# Patient Record
Sex: Female | Born: 1985 | Race: Black or African American | Hispanic: No | Marital: Single | Smoking: Never smoker
Health system: Southern US, Community
[De-identification: ages and names within clinical notes are randomized; demographics above are authoritative.]

## PROBLEM LIST (undated history)

## (undated) DIAGNOSIS — K5909 Other constipation: Secondary | ICD-10-CM

## (undated) DIAGNOSIS — F329 Major depressive disorder, single episode, unspecified: Secondary | ICD-10-CM

## (undated) DIAGNOSIS — Z98891 History of uterine scar from previous surgery: Secondary | ICD-10-CM

## (undated) DIAGNOSIS — F32A Depression, unspecified: Secondary | ICD-10-CM

## (undated) HISTORY — PX: TUBAL LIGATION: SHX77

---
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
## 2003-01-09 ENCOUNTER — Emergency Department (HOSPITAL_COMMUNITY): Admission: EM | Admit: 2003-01-09 | Discharge: 2003-01-09 | Payer: Self-pay | Admitting: Emergency Medicine

## 2003-03-17 ENCOUNTER — Inpatient Hospital Stay (HOSPITAL_COMMUNITY): Admission: AD | Admit: 2003-03-17 | Discharge: 2003-03-17 | Payer: Self-pay | Admitting: Obstetrics and Gynecology

## 2003-03-26 ENCOUNTER — Emergency Department (HOSPITAL_COMMUNITY): Admission: AD | Admit: 2003-03-26 | Discharge: 2003-03-26 | Payer: Self-pay | Admitting: Family Medicine

## 2003-04-21 ENCOUNTER — Inpatient Hospital Stay (HOSPITAL_COMMUNITY): Admission: AD | Admit: 2003-04-21 | Discharge: 2003-04-21 | Payer: Self-pay | Admitting: Obstetrics and Gynecology

## 2003-05-12 ENCOUNTER — Inpatient Hospital Stay (HOSPITAL_COMMUNITY): Admission: AD | Admit: 2003-05-12 | Discharge: 2003-05-12 | Payer: Self-pay | Admitting: Obstetrics and Gynecology

## 2003-07-01 ENCOUNTER — Emergency Department (HOSPITAL_COMMUNITY): Admission: EM | Admit: 2003-07-01 | Discharge: 2003-07-01 | Payer: Self-pay | Admitting: Emergency Medicine

## 2003-07-07 ENCOUNTER — Emergency Department (HOSPITAL_COMMUNITY): Admission: EM | Admit: 2003-07-07 | Discharge: 2003-07-08 | Payer: Self-pay | Admitting: Emergency Medicine

## 2003-07-13 ENCOUNTER — Inpatient Hospital Stay (HOSPITAL_COMMUNITY): Admission: AD | Admit: 2003-07-13 | Discharge: 2003-07-14 | Payer: Self-pay | Admitting: Family Medicine

## 2003-08-26 ENCOUNTER — Ambulatory Visit (HOSPITAL_COMMUNITY): Admission: RE | Admit: 2003-08-26 | Discharge: 2003-08-26 | Payer: Self-pay | Admitting: Obstetrics & Gynecology

## 2003-09-05 ENCOUNTER — Inpatient Hospital Stay (HOSPITAL_COMMUNITY): Admission: AD | Admit: 2003-09-05 | Discharge: 2003-09-05 | Payer: Self-pay | Admitting: Obstetrics

## 2003-09-23 ENCOUNTER — Inpatient Hospital Stay (HOSPITAL_COMMUNITY): Admission: AD | Admit: 2003-09-23 | Discharge: 2003-09-24 | Payer: Self-pay | Admitting: Obstetrics

## 2003-12-14 ENCOUNTER — Inpatient Hospital Stay (HOSPITAL_COMMUNITY): Admission: AD | Admit: 2003-12-14 | Discharge: 2003-12-16 | Payer: Self-pay | Admitting: Obstetrics

## 2004-08-13 ENCOUNTER — Emergency Department (HOSPITAL_COMMUNITY): Admission: EM | Admit: 2004-08-13 | Discharge: 2004-08-13 | Payer: Self-pay | Admitting: Emergency Medicine

## 2005-03-15 ENCOUNTER — Inpatient Hospital Stay (HOSPITAL_COMMUNITY): Admission: AD | Admit: 2005-03-15 | Discharge: 2005-03-15 | Payer: Self-pay | Admitting: Obstetrics & Gynecology

## 2005-03-22 ENCOUNTER — Inpatient Hospital Stay (HOSPITAL_COMMUNITY): Admission: RE | Admit: 2005-03-22 | Discharge: 2005-03-22 | Payer: Self-pay | Admitting: Obstetrics & Gynecology

## 2005-04-08 ENCOUNTER — Emergency Department (HOSPITAL_COMMUNITY): Admission: EM | Admit: 2005-04-08 | Discharge: 2005-04-08 | Payer: Self-pay | Admitting: Family Medicine

## 2005-06-09 ENCOUNTER — Emergency Department (HOSPITAL_COMMUNITY): Admission: EM | Admit: 2005-06-09 | Discharge: 2005-06-09 | Payer: Self-pay | Admitting: Family Medicine

## 2005-06-11 ENCOUNTER — Emergency Department (HOSPITAL_COMMUNITY): Admission: EM | Admit: 2005-06-11 | Discharge: 2005-06-11 | Payer: Self-pay | Admitting: Family Medicine

## 2005-07-04 ENCOUNTER — Emergency Department (HOSPITAL_COMMUNITY): Admission: EM | Admit: 2005-07-04 | Discharge: 2005-07-04 | Payer: Self-pay | Admitting: Family Medicine

## 2005-08-01 ENCOUNTER — Emergency Department (HOSPITAL_COMMUNITY): Admission: EM | Admit: 2005-08-01 | Discharge: 2005-08-01 | Payer: Self-pay | Admitting: Family Medicine

## 2005-11-20 ENCOUNTER — Inpatient Hospital Stay (HOSPITAL_COMMUNITY): Admission: AD | Admit: 2005-11-20 | Discharge: 2005-11-20 | Payer: Self-pay | Admitting: Obstetrics & Gynecology

## 2005-11-22 ENCOUNTER — Inpatient Hospital Stay (HOSPITAL_COMMUNITY): Admission: AD | Admit: 2005-11-22 | Discharge: 2005-11-22 | Payer: Self-pay | Admitting: Obstetrics & Gynecology

## 2006-02-08 ENCOUNTER — Other Ambulatory Visit: Admission: RE | Admit: 2006-02-08 | Discharge: 2006-02-08 | Payer: Self-pay | Admitting: Obstetrics and Gynecology

## 2006-07-16 ENCOUNTER — Inpatient Hospital Stay (HOSPITAL_COMMUNITY): Admission: AD | Admit: 2006-07-16 | Discharge: 2006-07-17 | Payer: Self-pay | Admitting: Obstetrics and Gynecology

## 2006-08-01 ENCOUNTER — Inpatient Hospital Stay (HOSPITAL_COMMUNITY): Admission: RE | Admit: 2006-08-01 | Discharge: 2006-08-03 | Payer: Self-pay | Admitting: Obstetrics and Gynecology

## 2008-01-24 ENCOUNTER — Inpatient Hospital Stay (HOSPITAL_COMMUNITY): Admission: AD | Admit: 2008-01-24 | Discharge: 2008-01-24 | Payer: Self-pay | Admitting: Obstetrics & Gynecology

## 2008-05-31 ENCOUNTER — Inpatient Hospital Stay (HOSPITAL_COMMUNITY): Admission: AD | Admit: 2008-05-31 | Discharge: 2008-05-31 | Payer: Self-pay | Admitting: Obstetrics and Gynecology

## 2008-08-09 ENCOUNTER — Inpatient Hospital Stay (HOSPITAL_COMMUNITY): Admission: AD | Admit: 2008-08-09 | Discharge: 2008-08-10 | Payer: Self-pay | Admitting: Obstetrics and Gynecology

## 2008-09-13 ENCOUNTER — Inpatient Hospital Stay (HOSPITAL_COMMUNITY): Admission: AD | Admit: 2008-09-13 | Discharge: 2008-09-13 | Payer: Self-pay | Admitting: Obstetrics and Gynecology

## 2008-09-14 ENCOUNTER — Emergency Department (HOSPITAL_COMMUNITY): Admission: EM | Admit: 2008-09-14 | Discharge: 2008-09-14 | Payer: Self-pay | Admitting: Emergency Medicine

## 2008-09-26 ENCOUNTER — Inpatient Hospital Stay (HOSPITAL_COMMUNITY): Admission: AD | Admit: 2008-09-26 | Discharge: 2008-09-30 | Payer: Self-pay | Admitting: Obstetrics and Gynecology

## 2008-09-27 ENCOUNTER — Encounter (INDEPENDENT_AMBULATORY_CARE_PROVIDER_SITE_OTHER): Payer: Self-pay | Admitting: Obstetrics and Gynecology

## 2009-11-23 ENCOUNTER — Emergency Department (HOSPITAL_COMMUNITY): Admission: EM | Admit: 2009-11-23 | Discharge: 2009-11-23 | Payer: Self-pay | Admitting: Emergency Medicine

## 2010-04-05 ENCOUNTER — Encounter: Payer: Self-pay | Admitting: Obstetrics and Gynecology

## 2010-06-21 LAB — CBC
MCHC: 32.7 g/dL (ref 30.0–36.0)
MCV: 73.8 fL — ABNORMAL LOW (ref 78.0–100.0)
RBC: 3.59 MIL/uL — ABNORMAL LOW (ref 3.87–5.11)
RBC: 4.59 MIL/uL (ref 3.87–5.11)
RDW: 16.9 % — ABNORMAL HIGH (ref 11.5–15.5)
WBC: 8 10*3/uL (ref 4.0–10.5)

## 2010-06-21 LAB — RAPID HIV SCREEN (WH-MAU): Rapid HIV Screen: NONREACTIVE

## 2010-06-21 LAB — RPR: RPR Ser Ql: NONREACTIVE

## 2010-06-23 LAB — DIFFERENTIAL
Basophils Absolute: 0 10*3/uL (ref 0.0–0.1)
Lymphocytes Relative: 11 % — ABNORMAL LOW (ref 12–46)
Monocytes Absolute: 0.6 10*3/uL (ref 0.1–1.0)
Monocytes Relative: 10 % (ref 3–12)
Neutro Abs: 4.9 10*3/uL (ref 1.7–7.7)
Neutrophils Relative %: 79 % — ABNORMAL HIGH (ref 43–77)

## 2010-06-23 LAB — URINALYSIS, ROUTINE W REFLEX MICROSCOPIC
Leukocytes, UA: NEGATIVE
Nitrite: NEGATIVE
Specific Gravity, Urine: 1.025 (ref 1.005–1.030)
Urobilinogen, UA: 1 mg/dL (ref 0.0–1.0)

## 2010-06-23 LAB — URINE MICROSCOPIC-ADD ON

## 2010-06-23 LAB — CBC
HCT: 30.3 % — ABNORMAL LOW (ref 36.0–46.0)
MCV: 77.1 fL — ABNORMAL LOW (ref 78.0–100.0)
Platelets: 129 10*3/uL — ABNORMAL LOW (ref 150–400)
RDW: 13.8 % (ref 11.5–15.5)

## 2010-06-23 LAB — COMPREHENSIVE METABOLIC PANEL
Albumin: 2.6 g/dL — ABNORMAL LOW (ref 3.5–5.2)
BUN: 2 mg/dL — ABNORMAL LOW (ref 6–23)
Creatinine, Ser: 0.55 mg/dL (ref 0.4–1.2)
Glucose, Bld: 101 mg/dL — ABNORMAL HIGH (ref 70–99)
Total Bilirubin: 0.8 mg/dL (ref 0.3–1.2)
Total Protein: 5.7 g/dL — ABNORMAL LOW (ref 6.0–8.3)

## 2010-06-25 LAB — URINALYSIS, ROUTINE W REFLEX MICROSCOPIC
Bilirubin Urine: NEGATIVE
Glucose, UA: NEGATIVE mg/dL
Ketones, ur: NEGATIVE mg/dL
Nitrite: NEGATIVE
Protein, ur: 100 mg/dL — AB
Specific Gravity, Urine: >1.03 — ABNORMAL HIGH (ref 1.005–1.030)
Urobilinogen, UA: 1 mg/dL (ref 0.0–1.0)
pH: 6 (ref 5.0–8.0)

## 2010-06-25 LAB — URINE CULTURE

## 2010-06-25 LAB — URINE MICROSCOPIC-ADD ON

## 2010-06-25 LAB — WET PREP, GENITAL
Clue Cells Wet Prep HPF POC: NONE SEEN
Trich, Wet Prep: NONE SEEN
Yeast Wet Prep HPF POC: NONE SEEN

## 2010-06-25 LAB — FETAL FIBRONECTIN: Fetal Fibronectin: NEGATIVE

## 2010-06-25 LAB — CULTURE, BETA STREP (GROUP B ONLY)

## 2010-07-28 NOTE — Op Note (Signed)
Tiffany Hanna, Tiffany Hanna             ACCOUNT NO.:  [id number]   MEDICAL RECORD NO.:  [id number]          PATIENT TYPE:  INP   LOCATION:  9101                          FACILITY:  [facility]   PHYSICIAN:  Janine Limbo, M.D.DATE OF BIRTH:  1985-11-06   DATE OF PROCEDURE:  09/27/2008  DATE OF DISCHARGE:                               OPERATIVE REPORT   PREOPERATIVE DIAGNOSES:  1. 41 weeks' gestation.  2. Nonreassuring fetal heart rate tracing.   POSTOPERATIVE DIAGNOSES:  1. 41 weeks' gestation.  2. Nonreassuring fetal heart rate tracing.  3. Thick meconium fluid.  4. Knot in umbilical cord.   PROCEDURE:  Primary low transverse cesarean section.   SURGEON:  Janine Limbo, MD   FIRST ASSISTANT:  Jasmine Awe, CNM.   ANESTHETIC:  Spinal.   DISPOSITION:  Ms. Livengood is a 25 year old female, gravida 5, para 3-0-2-  3, who presents at 6 weeks' gestation.  She has been followed at the  [facility].  She was admitted on September 26, 2008, for induction of  labor because of postdates.  She was given Cytotec and began having late  decelerations.  She was given oxygen, fluid, and terbutaline.  Her  contractions resolved and the decelerations resolved.  However, the  patient began to contract spontaneously and once again she began having  late decelerations.  The patient's cervix was once 1-2 cm dilated, 25-  50% effaced, and a -3 station.  The decision was made to proceed with  cesarean delivery.  We reviewed the risks of cesarean section including,  but not limited to, anesthetic complications, bleeding, infections, and  possible damage to the surrounding organs.  The patient did not want a  tubal ligation.   FINDINGS:  A 9-pound 1-ounce female infant Peyton Najjar) was delivered with the  assistance of a Kiwi vacuum extractor.  The Apgar scores were 8 at 1  minute and 9 at 5 minutes.  The arterial cord blood pH was  7.15.  The  amniotic fluid had thick meconium.  The infant was meconium stained.  The placenta and the uterus were also meconium stained.   PROCEDURE IN DETAILS:  The patient was taken to the operating room where  a spinal anesthetic was given.  The patient's abdomen, perineum, and  outer vagina were prepped with multiple layers of Betadine.  A Foley  catheter was placed in the bladder.  The patient was sterilely draped.  The lower abdomen was injected with 10 mL of 0.5% Marcaine with  epinephrine.  A low transverse incision was made in the abdomen and  carried sharply through the subcutaneous tissue, the fascia, and the  anterior peritoneum.  An incision was made in the lower uterine segment  and extended in a low transverse fashion.  The fetal head was delivered  with the assistance of a Kiwi vacuum extractor.  Several pop offs  occurred.  The mouth and nose were suctioned using the DeLee trap.  The  remainder of the infant was then delivered.  The cord was clamped and  cut.  The cord was noted to have a knot.  Routine cord blood studies  were obtained.  The placenta was removed and sent to Pathology.  The  uterine cavity was cleaned of amniotic fluid, clotted blood, and  membranes.  The uterine incision was closed using a running locking  suture of 2-0 Vicryl followed by an imbricating suture of 2-0 Vicryl.  The bladder flap was closed using 2-0 Vicryl.  The pelvis was vigorously  irrigated.  The fallopian tubes and the ovaries appeared normal.  The  anterior peritoneum was closed using a running suture of 3-0 Vicryl.  The fascia and the subcutaneous layer were irrigated.  The fascia was  closed using a running suture of 0 Vicryl followed by 3 interrupted  sutures of 0 Vicryl.  The subcutaneous layer was closed using a running  suture of 3-0 Vicryl.  The skin was reapproximated using a subcuticular  suture of 3-0 Monocryl.  Sponge, needle, and instrument counts were  correct on 2  occasions.  The estimated blood loss for the procedure was  700 mL.  The patient tolerated her procedure well.  The patient was  taken to the recovery room in stable condition.  The infant was taken to  the Full-Term Nursery in stable condition.  The placenta was sent to  Pathology for evaluation.      Janine Limbo, M.D.  Electronically Signed     AVS/MEDQ  D:  09/27/2008  T:  09/27/2008  Job:  [id number]

## 2010-07-28 NOTE — Discharge Summary (Signed)
Tiffany Hanna, Tiffany Hanna             ACCOUNT NO.:  [id number]   MEDICAL RECORD NO.:  [id number]          PATIENT TYPE:  INP   LOCATION:  9101                          [facility]   PHYSICIAN:  Janine Limbo, M.D.DATE OF BIRTH:  12-16-1985   DATE OF ADMISSION:  09/26/2008  DATE OF DISCHARGE:  09/30/2008                               DISCHARGE SUMMARY   ADMISSION DIAGNOSES:  Intrauterine pregnancy at 40-5/7th, late prenatal  care, history of drug abuse, depression, history of anemia, chronic  constipation, and group B streptococcus.   DISCHARGE DIAGNOSES:  Primary cesarean section for nonreassuring fetal  heart rate tracing, nuchal cord x1 with a true knot in the cord.   A 25 year old gravida 5, para 3, preterm 0, AB 0, therapeutic 1, living  children 3 with last menstrual period December 14, 2007, equals an [facility] of  September 20, 2008, presented to Labor and Delivery for induction for  postdates per patient request.  Cytotec was placed, and 2-3 hours after  the Cytotec was inserted, the fetal heart rate went down with a  nonreassuring tracing.  The Cytotec was not replaced, and the patient  slept until 5:30 the morning of the surgical procedure, again with  repetitive late decelerations down to 40.  Dr. Stefano Gaul was notified,  and the patient was sent for immediate C-section.  A primary low  transverse C-section was performed by Dr. Stefano Gaul, 41 weeks,  nonreassuring fetal heart rate tracing, thick meconium, and knot in the  cord and the procedure was a first-degree low transverse C-section.  Baby weighed 9 and 1, his name is Danny, and the Apgars were 8 and 9,  the cord pH was 7.15 under spinal anesthesia.  Postpartum course was  uneventful, and on September 30, 2008, at 7:30 the patient wanted to go home,  requests Depo prior to insertion of the Mirena.  Her hemoglobin was 8.1,  temp 97.9, 84, 16, and blood pressure 111/67.  Lungs are clear.  Heart,  regular rate without murmur.  Fundus  was firm.  Abdomen soft.  Incision  healing, looked clean and dry and no odor.  Lochia was rubra.  Negative  Homans.   ASSESSMENT:  Postpartum day #3, satisfactory, asymptomatic anemia.   PLAN:  1. Discharge home with [facility] instructions.  Pamphlet was given.  2. We will give Depo-Provera 150 mg prior to discharge.  3. She is to return in 4-6 weeks to schedule her Mirena.  4. Increase fluids, continue iron at home, rest; measures discussed      for drying her upper breasts with bra or towel ice packs or cabbage      leaves.   She was discharged home with ibuprofen 800 mg 1 every 4-6 hours p.r.n.  for pain, Percocet 5/325, 1 p.o. q.6 h. for severe pain.  She is to  continue iron at home.  She is to return in 4-6 weeks for Mirena  placement.      Jasmine Awe, CNM      Janine Limbo, M.D.  Electronically Signed    JM/MEDQ  D:  09/30/2008  T:  10/01/2008  Job:  [id number]

## 2010-07-28 NOTE — H&P (Signed)
NAMEDAMIA, BOBROWSKI             ACCOUNT NO.:  [id number]   MEDICAL RECORD NO.:  [id number]          PATIENT TYPE:  INP   LOCATION:  9160                          [facility]   PHYSICIAN:  Hal Morales, M.D.DATE OF BIRTH:  October 01, 1985   DATE OF ADMISSION:  08/09/2008  DATE OF DISCHARGE:                              HISTORY & PHYSICAL   ADDENDUM:   PHYSICAL ASSESSMENT:  Ms. Rotz is alert and oriented x3.  Her lungs  are clear to auscultation bilaterally.  She has trace to +1 edema of her  extremities.  She has normal DTRs.  Her Homan's sign is negative.  Her  abdomen is soft.  Her uterus is gravid, but nontender with relaxed tone.  She was contracting when she arrived which resolved after IV hydration  and Procardia 30 mg x1 dose.  Her vaginal examination showed the cervix  to be closed, thick, posterior and long.  Also when she arrived she had  greater than 80 ketones in her urine.  The fetal heart rate had a  baseline of 140 and was reactive and reassuring.  Contractions have  subsided at this point.   IMPRESSION:  A 25 year old G5, P 3-0-1-3 at 34.[redacted] weeks gestation,  reassuring fetal heart rate, acute gastritis, tachycardia.   PLAN:  The patient is to be admitted for IV hydration and management of  her gastritis symptoms.  She is to continue Zofran and Phenergan p.r.n.  Admission blood work is being ordered.  Activity includes bedrest with  bathroom privileges.  Her diet is to be advanced as tolerated and she  will have an NST every 8 hours.      Eulogio Bear, CNM      Hal Morales, M.D.  Electronically Signed    JM/MEDQ  D:  08/09/2008  T:  08/10/2008  Job:  [id number]

## 2010-07-28 NOTE — H&P (Signed)
Tiffany, Hanna             ACCOUNT NO.:  [id number]   MEDICAL RECORD NO.:  [id number]          PATIENT TYPE:  INP   LOCATION:  9163                          [facility]   PHYSICIAN:  Crist Fat. Rivard, M.D. DATE OF BIRTH:  05-22-1985   DATE OF ADMISSION:  08/01/2006  DATE OF DISCHARGE:                              HISTORY & PHYSICAL   ADMISSION HISTORY AND PHYSICAL   CHIEF COMPLAINT:  [name] is a 25 year old gravida 4 para 2-0-1-2 at  73 and 6/7 weeks who presents today for induction secondary to post-  dates.  Her pregnancy has been remarkable for: (1) chronic constipation,  (2) history of anemia, (3) history of STD's, (4) first trimester  marijuana use, (5) history of depression.   PRENATAL LABS:  Blood type B positive, Rh antibody negative.  VDRL  nonreactive.  Rubella titer positive.  Hepatitis B surface antigen  negative.  HIV is nonreactive.  Syphilis test negative, GC and chlamydia  cultures were negative in September as well as at 36 weeks.  Pap was  normal.  The patient had a positive UTI in the first trimester.  This  was treated.  Follow-up urine culture was still positive Staph aureus.  Glucola was normal.  Hemoglobin initially was 10.9.  At her new OB  visit, it was 11.4 at 26 weeks.  Group B Strep culture and other  cultures were negative at 36 weeks.   HISTORY OF PRESENT PREGNANCY:  The patient entered care at approximately  16 weeks.  She had a question of a need for GI consult secondary to  history of chronic constipation.  She had an ultrasound at 5-6 weeks at  [facility] that confirmed dating criteria.  Following her first  visit, she had a positive urine culture with Staph coagulase negative  which was sensitive to Macrobid.  She was treated for this.  She had an  ultrasound at 18 weeks for normal growth.  Limited heart, anatomy and  profile - these were followed up at her next visit.  She had had  Macrobid prescribed but had not taken it.   Urine culture still showed  greater than 100,000 colonies of Staph aureus which was sensitive to  Macrobid.  She was prescribed Macrobid again since she did not take it  initially.  It did seem to resolve symptoms.  Glucola was normal.  She  had an ultrasound at 32 weeks showing growth at the 65th percentile and  normal fluid and normal follow-up anatomy.  Estimated fetal weight was  at the 75th percentile, fluid was at the 65th percentile.  The rest of  her pregnancy was essentially uncomplicated.  She was seen by Dr. Estanislado Pandy  on Friday with cervix 3-3.5 cm, 50%, head at -2 station.  She was  scheduled for induction secondary to post-date.   OBSTETRICAL HISTORY:  In 2004 she had a vaginal birth of a female infant  who weighed 7 pounds 8 ounces at 40 weeks.  She was in labor 13 hours.  She had epidural anesthesia.  There was some meconium in the fluid.  There were  no complications.  She did have hyperemesis with that  pregnancy.  She did not have Group B Strep with that pregnancy.  In 2005  she had a vaginal birth of a female infant who weighed 7 pounds 8 ounces  at 40 weeks.  She was in labor 8 hours.  She had an epidural anesthesia  without complication.  In 2007 she had a therapeutic termination of  pregnancy in the first trimester.   PAST MEDICAL HISTORY:  She is a previous condom user.  She had an  abnormal Pap in 2002 and had a colposcopy but everything has been normal  since.  In 2001 she had chlamydia and GC diagnosed.  In 2006 she had  Trichomonas.  She reports usual childhood illnesses.  She did have Group  B Strep with her second pregnancy.  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  In 2007 she had a UTI prior to pregnancy.   PAST SURGICAL HISTORY:  Wisdom teeth removed at age 38 and therapeutic  termination of pregnancy in 2007.  The only other hospitalization was  for childbirth.   FAMILY HISTORY:  Her maternal grandfather has heart disease.  Her  mother,  maternal aunt have hypertension.  Her maternal grandmother has  an aneurysm and is now deceased.  Her maternal aunt had some type of  blood disorder but it is unknown clearly what that is.  Her mother and  maternal aunt have asthma.  Her maternal grandfather has diabetes.  Maternal aunt bipolar and schizophrenic.  Her mother and brother are  smokers.  Maternal aunts and mother have been drug abusers in the past  and her mother is HIV positive.   GENETIC HISTORY:  The patient's maternal aunt and maternal grandmother  and cousins all have extra digits.  Her brother has a heart murmur.  The  baby's father has two sets of twins.  His mother has one set of twins  and his sister has one set of twins.   SOCIAL HISTORY:  The patient is single, father of the baby is involved  and supportive.  His name is Secretary/administrator.  The patient has a 9th  grade education.  She is a Futures trader.  Her partner has a GED.  He is  employed in [employer].  She has been followed by the certified midwife  service at [facility].  She denies any alcohol or tobacco use  during this pregnancy.  She did have some limited alcohol use prior to  pregnancy with marijuana daily and one experience with Ecstasy prior to  pregnancy.  She has had no drug use during pregnancy.  She is sensitive  to latex which causes itching.   PHYSICAL EXAMINATION:  VITAL SIGNS: Stable.  The patient is afebrile.  HEENT: Within normal limits.  LUNGS: Her breath sounds are clear.  HEART: Regular rate and rhythm without murmur.  BREASTS: Soft and nontender.  ABDOMEN: Fundal height approximately 39 cm., estimated fetal weight 7-8  pounds.  Uterine contractions are irregular and mild.  Fetal heart rate  is reactive.  GU: Cervix is 3-3.5 cm 50%, vertex is at a -2 to -3 station with vertex  presentation verified by bedside ultrasound. EXTREMITIES: Deep tendon reflexes were 2+ without clonus.  There was  trace edema noted.   IMPRESSION:  1.  Intrauterine pregnancy at 40 and 6/7 weeks.  2. Favorable cervix.  3. Negative Group B Strep.  4. Post-dates.   PLAN:  1. Admit to [facility] for consult with Dr. Dois Davenport Rivard who is      the attending physician.  2. Routine certified nurse midwife orders.  3. Pitocin per low dose induction with risks and benefits of induction      reviewed with the patient including failure      of method, prolonged labor, and possible requirement of C-section.      The patient and her family seemed to understand these risks and do      wish to proceed with induction.  4. Epidural p.r.n.      Renaldo Reel Emilee Hero, C.N.M.      Crist Fat Rivard, M.D.  Electronically Signed    VLL/MEDQ  D:  08/01/2006  T:  08/01/2006  Job:  [id number]

## 2010-07-28 NOTE — H&P (Signed)
NAMESUPRIYA, BEASTON             ACCOUNT NO.:  [id number]   MEDICAL RECORD NO.:  [id number]          PATIENT TYPE:  INP   LOCATION:  9101                          [facility]   PHYSICIAN:  Janine Limbo, M.D.DATE OF BIRTH:  [birth year]   DATE OF ADMISSION:  09/26/2008  DATE OF DISCHARGE:                              HISTORY & PHYSICAL   A 25 year old gravida 5, part 3, preterm 0, AB 0, therapeutic AB 1,  living children 3 with last menstrual period December 14, 2007 which equal  an [facility] of September 20, 2008.  Ultrasound date confirms this dating, she is 67-  5/7, presents to Labor and Delivery for induction for postdate and  discomforts with pregnancy.   ADMISSION DIAGNOSES:  1. Intrauterine pregnancy at 40-5/7 postdate.  2. Late to prenatal care.  3. History of sexually transmitted infection.  4. History of drug abuse.  5. History of depression, denies any depression now.  6. Increased BMI.  7. History of anemia at 10.8.  8. Chronic constipation.  9. GBS was negative.   HISTORY OF PRESENT ILLNESS:  Presented to [facility]  beginning at 1:15 at 40-6/7 weeks.  The history of pregnancy, she has  been positive for BV and treated.  She was treated for yeast infection,  which resolved.  The ultrasound at 20 weeks within normal limit, anatomy  scan.  She was treated for anemia with the hemoglobin of 10.8 with  Integra.  On August 25, 2008 she was admitted to the [facility] for  stomach flu, treated with fluids and Phenergan and discharged home.   LABORATORY DATA:  She is B positive, platelets are 204.  Sickle cell  trait negative.  VDRL negative.  HBSAG negative.  GBS negative.  Quad  screen within normal limits.  Pap within normal limits. Chlamydia  negative.  GB negative.  No Trich at this pregnancy.   PAST MEDICAL HISTORY:  She is allergic to penicillin, she states.  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  When she was 66-56 years old, she  took some medication  while she was living in a shelter.  History of  depression.  No meds now.   SURGICAL HISTORY:  She had a colpo in 2002 for abnormal Pap.  PAPs are  within normal limits now.  She had a therapeutic AB in 2007.  Wisdom  teeth were pulled at 25 years old.   PAST OB/GYN HISTORY:  Menarche at 25 years old, every 28-30 days, they  lasts for 3 days.  She had a colpo and a therapeutic abortion, sexually  transmitted infection.  Chlamydia, Trichomonas, and gonorrhea in the  past.   DELIVERY HISTORY:  In 2004, she had a spontaneous vaginal delivery of a  viable female infant at 40 weeks, 13 hours labor under epidural  anesthesia.  Baby had meconium. In 2005, spontaneous vaginal delivery of  a viable female infant at 40 weeks, 8 hours of labor, epidural anesthesia,  and no problems.  In 2007, she had an 8 week therapeutic AB.  In 2008  spontaneous vaginal delivery of 8 pounds  5 ounce baby girl, 6-1/2 hours  of labor and no complications.   FAMILY HISTORY:  Heart disease in maternal grandfather, has high blood  pressure in maternal aunt and maternal grandmother.  Her maternal  grandmother has an aneurysm.  The patient's mother has asthma and  maternal aunt, diabetes in maternal grandfather, psychiatric diseases in  her maternal aunt is bipolar, her mother has HIV positive.   SOCIAL HISTORY:  The patient has history of drug abuse, alcohol,  marijuana, and Ecstasy used in the past, none now.  She finished the 9th  grade.  She is unemployed.   PHYSICAL EXAMINATION:  VITAL SIGNS:  Temperature 98.1, blood pressure  127/63, pulse 99, respirations 18.  HEART:  Regular rate without murmur.  LUNGS:  Clear bilaterally.  ABDOMEN:  Gravid, estimated fetal weight 8-1/2 pounds per Leopold's on  ultrasound, occasional contraction, the patient does not feel.  Vaginal  exam was 2 cm, vertex, contractions every 6 minutes.  The patient stated  she has not feeling contractions, but she started having them  earlier  today.  Fetal heart tone 130-140 with accelerations noted on strip.  Reflexes are 1+.   ASSESSMENT:  Intrauterine pregnancy at 40-5/7 gestational age, induction  for postdate.   PLAN:  Admit to [employer], CNM.  Dr. Stefano Gaul is the doctor of record.  IV fluids inserted per protocol, induction per protocol.  Pain  medication is needed considering an epidural, anticipate vaginal  delivery.  Consult was needed with Dr. Stefano Gaul for any problems or  concerns.      Jasmine Awe, CNM      Janine Limbo, M.D.  Electronically Signed    JM/MEDQ  D:  09/26/2008  T:  09/27/2008  Job:  [id number]

## 2010-07-28 NOTE — H&P (Signed)
NAMEKACELYN, ROWZEE             ACCOUNT NO.:  [id number]   MEDICAL RECORD NO.:  [id number]          PATIENT TYPE:  INP   LOCATION:  9160                          [facility]ILITY:  [facility]   PHYSICIAN:  Hal Morales, M.D.DATE OF BIRTH:  12/11/1985   DATE OF ADMISSION:  08/09/2008  DATE OF DISCHARGE:                              HISTORY & PHYSICAL   The patient is being admitted at 109 weeks gestation for management of  gastritis.   HISTORY OF PRESENT ILLNESS:  Ms. Louis is a 25 year old gravida 5,  para 3-0-1-3 at [redacted] weeks gestation.  She is followed by the midwives at  [facility].  Her pregnancy is remarkable for:  1. Late to care.  2. Chronic constipation.  3. Alcohol recovery  4. Increased BMI.  5. Anemia.  6. Recurrent BV and yeast.  7. History of drug use.  8. History of depression.  9. History gonorrhea, Chlamydia and Trichomonas.  10.History of abnormal Pap and colposcopy.  11.Allergic to penicillin and latex.   HISTORY OF PRESENT PREGNANCY:  Ms. Fayson presented for care at  approximately 15 weeks' gestation.  She did have BV at her first visit.  Early glucose was suggested due to increased BMI.  She did have BV.  She  was ordered a Flagyl prescription and a quad screen was ordered which  was normal.  At 18.6 weeks she had early Glucola.  The results are not  in the chart.  She had another follow-up wet prep which showed a yeast  infection and was prescribed Terazol 7.  She had anatomy scan which  showed a single intrauterine pregnancy with size concordant with dates  and normal anatomy, normal fluid.  Cervix 4.53 cm long, posterior  placenta with three-vessel cord.  At 22 weeks she was complaining of  tenderness around her navel.  At 25 weeks she saw Dr. Pennie Rushing for  assessment of her tenderness around her navel but it was resolved  temporarily.  At 29 weeks she had another Glucola which was normal at  126.  At that time her hemoglobin was 10.8 and her RPR  was nonreactive.  She was encouraged to take Prefera-OB prenatal vitamin and an iron  supplement, however she does not like taking vitamins or iron and will  not take them.  At 30 weeks she had wet prep which showed another yeast  infection and was prescribed Terazol again and that was the last  appointment she had.  She missed her appointment following that was due  two weeks ago due to transportation issues.   PRENATAL LABS:  Ms. Baumbach initial hemoglobin was 11.3, hematocrit  35, platelets 204, blood type B+, antibody screen negative, sickle cell  trait screen negative, RPR nonreactive x2.  Rubella titer immune,  hepatitis B negative, I do not see the HIV.  The Pap test normal in  March of a year ago, negative gonorrhea and Chlamydia, quad screen  normal and normal Glucolas and a hemoglobin of 10.8 at 29 weeks.  She  does not have any third trimester beta strep or cultures.   CURRENT MEDICATIONS:  None.   ALLERGIES:  PENICILLIN AND LATEX.  PENICILLIN causes hives.   PAST MEDICAL HISTORY:  Ms. Dembeck began menstruation at age 52 with  cycles occurring every 28-30 days lasting 3 days.  She has a history of  hyperemesis, abnormal Pap smear, chlamydia, gonorrhea, Trichomonas, GBS.  She had the usual childhood illnesses including chickenpox.  She was  treated for tuberculosis at age 21 following living in a shelter with her  mother.  She at times had bladder infections.  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  She has used marijuana and ecstasy in the past.   FAMILY HISTORY:  Maternal grandfather with heart disease.  Mother and  maternal aunt with high blood pressure and maternal aunts with some type  of blood disorder that is not specified.  Mother and maternal aunt with  asthma.  Maternal grandfather with diabetes.  Maternal aunts bipolar  disorder and schizophrenia.  Mother and brother are smokers.  Her mother  is recovering drug addict and is HIV positive.  Maternal aunt,  maternal  grandmother and maternal cousins with extra digits and her brother has  heart murmur.   SURGICAL HISTORY:  Includes wisdom teeth extraction at age 54 and  therapeutic abortion in 2007.   SOCIAL HISTORY:  Ms. Naef is single African American with a ninth  grade education.  No employment listed.  The father of baby, there is no  information on him in the records and he is not present with the  patient.  She does deny use of alcohol, tobacco or street drugs during  the pregnancy.  Ms. Davenport presented today with acute gastritis which  has also been present in one of her children this week.  She is feeling  dizzy and having floaters in her vision.  She was given a couple liters  of fluid via IV as well as multivitamins in one bag and Zofran piggyback  and seemed to have gotten better and was being discharged home to go  pick up some Phenergan prescription that had been actually called in the  office earlier today, which she never got and was getting ready to leave  and suddenly felt much worse and vomited about half a liter of fluids  and repeat vital signs showed her pulse rate 122 and she was feeling  very dizzy.  Per consultation with Dr. Pennie Rushing the patient is being  admitted for IV hydration and management of her gastritis symptoms.  She  is getting a CBC and complete metabolic panel and is going to continue  IV fluids and Zofran and Phenergan p.r.n. with diet to be advanced as  tolerated and bedrest with bathroom privileges.      Eulogio Bear, CNM      Hal Morales, M.D.  Electronically Signed    JM/MEDQ  D:  08/09/2008  T:  08/10/2008  Job:  [id number]

## 2010-07-28 NOTE — Discharge Summary (Signed)
NAMESANTIANA, GLIDDEN             ACCOUNT NO.:  [id number]   MEDICAL RECORD NO.:  [id number]          PATIENT TYPE:  INP   LOCATION:  9160                          [facility]:  [facility]   PHYSICIAN:  Osborn Coho, M.D.   DATE OF BIRTH:  13-Jul-1985   DATE OF ADMISSION:  08/09/2008  DATE OF DISCHARGE:  08/10/2008                               DISCHARGE SUMMARY   ADMISSION DIAGNOSES:  1. Intrauterine pregnancy at 27 and 0/7th weeks.  2. Gastroenteritis.   DISCHARGE DIAGNOSES:  1. Intrauterine pregnancy at 27 and 1/7th weeks.  2. Gastroenteritis, resolved.   PROCEDURE:  1. Intravenous fluid hydration.  2. Fetal monitoring.   HOSPITAL COURSE:  Ms. Cleverly is a 25 year old gravida 5, para 3-0-1-3  who is admitted at 27 and 0/7th weeks with persistent nausea and  vomiting.  The patient with onset of nausea and vomiting on the day of  admission for 24 hours.  The patient's pregnancy remarkable for:  1. Late entry to prenatal care.  2. Chronic constipation.  3. Alcohol recovery.  4. Increased body mass index.  5. Anemia.  6. Recurrent BV and yeast.  7. History of drug use.  8. History of depression.  9. History of Gonorrhea, Chlamydia, and Trichomonas.  10.History of abnormal Pap and colposcopy.   The patient was initially seen in Maternity Admissions Unit.  At that  time, the patient was noted to have ketonuria as well as concentrated  urine.  Initially on presentation, the patient with uterine contractions  every 2-4 minutes.  However, they were poorly proceeded by the patient.  Cervical exam was closed, thick and posterior.  The patient initially  treated for symptoms with Zofran prior to admission.  The patient was  given IV fluids and Procardia upon admission to Maternity Admissions.  After several hours of observation, the patient was tolerating sips and  crackers.  The uterine contractions had resolved on the monitor.  The  patient with no further nausea and vomiting and was  given discharge  precautions.  However, just immediately prior to discharge, the patient  began with recurrent nausea and vomiting again.  Therefore, she was  admitted for overnight observation and IV fluids.  The patient was  admitted for IV hydration.  The patient was maintained on continuous  fetal monitoring.  The patient's fetal heart rate remained reactive  throughout her hospital stay.  The patient continued with occasional  irregular uterine contractions.  The patient denied any increased  intensity of contractions.  The patient was treated with IV Zofran.   ADMISSION LABORATORY DATA:  Hemoglobin of 10.3, platelets 129,000, and  metabolic panel was within normal limits.   The patient rested well throughout the night and on the afternoon, after  admission on May 29, the patient with no further nausea and vomiting.  The patient had requested and consumed a regular diet without nausea and  vomiting.  The patient with no fever, uterine contractions, leakage of  fluid or bleeding.  The patient reports that her fetus was moving  normally.  Fetal heart rate reactive on fetal heart rate monitor.  The  patient desired discharge.   It was felt that the patient had received full benefit of her hospital  stay and was discharged home.   DISCHARGE INSTRUCTIONS:  The patient to follow up in the office of  [facility] in the upcoming week.  The patient was given  preterm labor as well as fetal kick count precautions as well.   DISCHARGE MEDICATIONS:  None.   As noted above, follow up will occur in the upcoming week at [facility].      Rhona Leavens, CNM      Osborn Coho, M.D.  Electronically Signed    NOS/MEDQ  D:  08/10/2008  T:  08/11/2008  Job:  [id number]

## 2010-07-31 NOTE — H&P (Signed)
NAME:  Tiffany Hanna, Tiffany Hanna                       ACCOUNT NO.:  [id number]   MEDICAL RECORD NO.:  [id number]                   PATIENT TYPE:  INP   LOCATION:  9164                                 [facility]   PHYSICIAN:  Hal Morales, M.D.             DATE OF BIRTH:  Dec 18, 1985   DATE OF ADMISSION:  05/03/2002  DATE OF DISCHARGE:                                HISTORY & PHYSICAL   HISTORY OF PRESENT ILLNESS:  Ms. Monical is a 25 year old single black  female, primigravida, at 83 and 4/7ths weeks who presents from [facility] with spontaneous rupture of membranes at approximately 6:30 p.m.  She was visiting a friend at that time.  At the hospital the fluid noted  with rupture was meconium stained.  She reports mild uterine contractions  and denies headache, nausea, vomiting, or visual disturbances.  Her  pregnancy has been followed by the [facility] Certified Nurse  Midwife Service and has been remarkable for 1) history of STDs, 2) history  of depression, 3) first trimester bleeding, 4) group B strep negative, 5)  questionable latex sensitivity/allergies.   Her prenatal labs were collected on 01/31/02.  Her hemoglobin was 10.9,  hematocrit 33.5, platelets 217,000, blood type B+, antibody negative, sickle  cell trait negative, RPR nonreactive, rubella immune, hepatitis B surface  antigen negative.  Pap smear within normal limits.  Gonorrhea negative,  Chlamydia negative.  On 11/07/01, her maternal serum alpha fetoprotein was  within the normal range.  Her one hour Glucola on 01/31/02 was 111 with  hemoglobin at that time 11.1.  Culture of the vaginal tract for group B  strep, gonorrhea, Chlamydia on 04/02/01 were all negative.   HISTORY OF PRESENT PREGNANCY:  She presented for care on 10/11/01 at  approximately 9-1/[redacted] weeks gestation.  Pregnancy ultrasonography at [redacted] weeks  gestation showed growth consistent with previous dating.  There was a band  noted in  the fundal area.  She was treated for an upper respiratory  infection at [redacted] weeks gestation.  Second pregnancy ultrasonography at 32  weeks showed growth at the 75th to 90th percentile, cephalic presentation,  normal fluid index, and the band was no longer visualized.  The rest of her  prenatal care was unremarkable.   OB HISTORY:  She is a primigravida.   MEDICAL HISTORY:  SHE HAS A QUESTIONABLE LATEX ALLERGY/SENSITIVITY.  She has  used Depo-Provera for a year in the past for contraception, followed by oral  contraceptives.  She had an abnormal Pap smear in 2002 for which she was  followed by a colposcopy.  In 11/02, she was treated for Chlamydia and  gonorrhea.  She reports occasional yeast infection.  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  She has  questionable gastroesophageal reflux disease for which she was seen at [facility]  for chest pain and was diagnosed with GERD at that time.  She  reports being counseled at the mental health office for approximately one  year for depression.  Multiple family members with drug use.  Genetic  history is remarkable for maternal grandfather with polydactyl.   FAMILY MEDICAL HISTORY:  Remarkable for maternal grandfather with a heart  catheterization.  Mother and maternal grandmother diagnosed with  hypertension.  Maternal aunt with emphysema.  Paternal grandfather with  history of diabetes.   SOCIAL HISTORY:  Father of the baby's name is Onalee Hua.  He is involved and  supportive.  The patient denies any alcohol, tobacco, or illicit drug use  with the pregnancy.  She has completed a 9th grade education and is employed  full time.   OBJECTIVE DATA:  VITAL SIGNS:  Stable.  She is afebrile.  HEENT:  Grossly within normal limits.  CHEST:  Clear to auscultation.  HEART:  Regular rate and rhythm.  ABDOMEN:  Gravid and with fundal height extending approximately 40 cm above  the pubic symphysis.   Fetal heart rate is reassuring.  Negative CST.  No  decelerations.  Uterine contractions every 1-1/2 to 3 minutes, which are  mild.  PELVIC:  Cervical exam is deferred.  She was 1 cm in the office on 05/02/02.  She is vertex to Leopold's.  Thin meconium-stained fluid is noted.  EXTREMITIES:  Within normal limits.   ASSESSMENT:  1. Intrauterine pregnancy at term.  2. Spontaneous rupture of membranes with meconium-stained fluid.  3. Group B strep negative.   PLAN:  1. Admit to birthing suite for consult with Dr. Pennie Rushing.  2. Routine CNM orders.  3. Discussed expectant management versus augmentation.  Risks and benefits     were reviewed.  The patient chooses Pitocin augmentation at this time.     Cam Hai, C.N.M.                     Hal Morales, M.D.    KS/MEDQ  D:  05/03/2002  T:  05/04/2002  Job:  [id number]

## 2010-10-15 ENCOUNTER — Inpatient Hospital Stay (HOSPITAL_COMMUNITY): Payer: Medicaid Other

## 2010-10-15 ENCOUNTER — Encounter (HOSPITAL_COMMUNITY): Payer: Self-pay | Admitting: *Deleted

## 2010-10-15 ENCOUNTER — Inpatient Hospital Stay (HOSPITAL_COMMUNITY)
Admission: AD | Admit: 2010-10-15 | Discharge: 2010-10-15 | Disposition: A | Discharge status: Home / Self Care | Payer: Medicaid Other | Source: Ambulatory Visit | Attending: Obstetrics & Gynecology | Admitting: Obstetrics & Gynecology

## 2010-10-15 DIAGNOSIS — B9689 Other specified bacterial agents as the cause of diseases classified elsewhere: Secondary | ICD-10-CM | POA: Insufficient documentation

## 2010-10-15 DIAGNOSIS — N760 Acute vaginitis: Secondary | ICD-10-CM | POA: Insufficient documentation

## 2010-10-15 DIAGNOSIS — O2691 Pregnancy related conditions, unspecified, first trimester: Secondary | ICD-10-CM

## 2010-10-15 DIAGNOSIS — O2390 Unspecified genitourinary tract infection in pregnancy, unspecified trimester: Secondary | ICD-10-CM | POA: Insufficient documentation

## 2010-10-15 DIAGNOSIS — R109 Unspecified abdominal pain: Secondary | ICD-10-CM | POA: Insufficient documentation

## 2010-10-15 DIAGNOSIS — Z3201 Encounter for pregnancy test, result positive: Secondary | ICD-10-CM

## 2010-10-15 DIAGNOSIS — O2690 Pregnancy related conditions, unspecified, unspecified trimester: Secondary | ICD-10-CM

## 2010-10-15 DIAGNOSIS — A499 Bacterial infection, unspecified: Secondary | ICD-10-CM | POA: Insufficient documentation

## 2010-10-15 DIAGNOSIS — O210 Mild hyperemesis gravidarum: Secondary | ICD-10-CM | POA: Insufficient documentation

## 2010-10-15 DIAGNOSIS — O9989 Other specified diseases and conditions complicating pregnancy, childbirth and the puerperium: Secondary | ICD-10-CM | POA: Insufficient documentation

## 2010-10-15 HISTORY — DX: Major depressive disorder, single episode, unspecified: F32.9

## 2010-10-15 HISTORY — DX: Depression, unspecified: F32.A

## 2010-10-15 HISTORY — DX: History of uterine scar from previous surgery: Z98.891

## 2010-10-15 LAB — URINALYSIS, ROUTINE W REFLEX MICROSCOPIC
Bilirubin Urine: NEGATIVE
Ketones, ur: NEGATIVE mg/dL
Leukocytes, UA: NEGATIVE
Nitrite: NEGATIVE
Protein, ur: NEGATIVE mg/dL
Urobilinogen, UA: 0.2 mg/dL (ref 0.0–1.0)
pH: 6 (ref 5.0–8.0)

## 2010-10-15 LAB — DIFFERENTIAL
Basophils Absolute: 0 10*3/uL (ref 0.0–0.1)
Basophils Relative: 1 % (ref 0–1)
Lymphocytes Relative: 34 % (ref 12–46)
Monocytes Absolute: 0.6 10*3/uL (ref 0.1–1.0)
Monocytes Relative: 9 % (ref 3–12)
Neutro Abs: 3.4 10*3/uL (ref 1.7–7.7)
Neutrophils Relative %: 54 % (ref 43–77)

## 2010-10-15 LAB — ABO/RH: ABO/RH(D): B POS

## 2010-10-15 LAB — WET PREP, GENITAL

## 2010-10-15 LAB — CBC
HCT: 35.1 % — ABNORMAL LOW (ref 36.0–46.0)
Hemoglobin: 11.8 g/dL — ABNORMAL LOW (ref 12.0–15.0)
RBC: 4.35 MIL/uL (ref 3.87–5.11)
WBC: 6.4 10*3/uL (ref 4.0–10.5)

## 2010-10-15 MED ORDER — METRONIDAZOLE 500 MG PO TABS: 500.0000 mg | ORAL_TABLET | Freq: Two times a day (BID) | ORAL | Status: AC

## 2010-10-15 NOTE — Progress Notes (Signed)
Upper right abd pain, onset 1 week ago, pain the same today, unknown preg state at this time

## 2010-10-15 NOTE — Progress Notes (Signed)
UPT positive at this visit, pt suspected preg , Depo last taken 3 months/[facility]/ was going to have IUD and decided against this, fearful of device

## 2010-10-15 NOTE — Progress Notes (Signed)
Pt states she has been having pain on both sides of her abdomen for the 1 week

## 2010-10-15 NOTE — Progress Notes (Signed)
Lives outside [place], has not been back to [facility] since last delivery/ July 2010, had 4 children with them

## 2010-10-15 NOTE — ED Provider Notes (Signed)
History     CSN: [id number] Arrival date & time: 10/15/2010  2:27 PM  Chief Complaint  Patient presents with  . Abdominal Pain    no heavy lifting or onset after eating, same pain level x 1 wk   HPI Tiffany Hanna is a 25 y.o. female at approximately [redacted] weeks gestation by her LMP who presents to MAU with complaint of abdominal pain that started about a week ago that comes and goes.   Past Medical History  Diagnosis Date  . Depression     with Depo Provera  . Previous cesarean section     9 # fetal distress    Past Surgical History  Procedure Date  . Cesarean section     2010    No family history on file.  History  Substance Use Topics  . Smoking status: Not on file  . Smokeless tobacco: Not on file  . Alcohol Use: Not on file    OB History    Grav Para Term Preterm Abortions TAB SAB Ect Mult Living   6 4 4  0 1 1 0 0 0 4      Review of Systems  Gastrointestinal: Positive for nausea and abdominal pain.  All other systems reviewed and are negative.    Physical Exam  BP 114/69  Pulse 79  Temp 99 F (37.2 C)  Resp 18  Ht 5' 5.5" (1.664 m)  Wt 212 lb (96.163 kg)  BMI 34.74 kg/m2  LMP 09/05/2010  Physical Exam  Nursing note and vitals reviewed. Constitutional: She is oriented to person, place, and time. She appears well-developed and well-nourished.  HENT:  Head: Normocephalic.  Eyes: EOM are normal.  Neck: Neck supple.  Pulmonary/Chest: Effort normal.  Abdominal: Soft. There is no tenderness.  Genitourinary: Vagina normal.       Cervix is long and closed, no bleeding, there is no adnexal tenderness. No palpable enlargement of uterus. Exam is limited by patient body habitus.  Musculoskeletal: Normal range of motion.  Neurological: She is alert and oriented to person, place, and time. No cranial nerve deficit.  Skin: Skin is warm and dry.    ED Course  Procedures  The ultrasound today shows a 5 wk. IUGS but no YS. The Bhcg is 6354. We will have the  patient return in 2 days to repeat the Bhcg. She will return sooner for problems. MDM Assessment : Abdominal pain in first trimester pregnancy.                        Bacterial vaginosis.  Plan:      Return in 2 days to repeat the bhcg.and sooner for problems.               Flagyl 500 mg. Po Bid x 7 days.     [place], [place] 10/15/10 1816

## 2010-12-15 LAB — URINALYSIS, ROUTINE W REFLEX MICROSCOPIC
Glucose, UA: NEGATIVE
Ketones, ur: 15 — AB
Nitrite: NEGATIVE
Protein, ur: NEGATIVE
pH: 7

## 2010-12-15 LAB — CBC
HCT: 37.1
MCV: 82.9
Platelets: 220
RDW: 13.7
WBC: 5

## 2010-12-15 LAB — HCG, QUANTITATIVE, PREGNANCY: hCG, Beta Chain, Quant, S: 10296 — ABNORMAL HIGH

## 2010-12-15 LAB — POCT PREGNANCY, URINE: Preg Test, Ur: POSITIVE

## 2011-01-05 ENCOUNTER — Inpatient Hospital Stay (HOSPITAL_COMMUNITY): Payer: Medicaid Other

## 2011-01-05 ENCOUNTER — Inpatient Hospital Stay (HOSPITAL_COMMUNITY)
Admission: AD | Admit: 2011-01-05 | Discharge: 2011-01-05 | Disposition: A | Discharge status: Home / Self Care | Payer: Medicaid Other | Source: Ambulatory Visit | Attending: Obstetrics & Gynecology | Admitting: Obstetrics & Gynecology

## 2011-01-05 ENCOUNTER — Encounter (HOSPITAL_COMMUNITY): Payer: Self-pay | Admitting: *Deleted

## 2011-01-05 DIAGNOSIS — O26899 Other specified pregnancy related conditions, unspecified trimester: Secondary | ICD-10-CM

## 2011-01-05 DIAGNOSIS — O210 Mild hyperemesis gravidarum: Secondary | ICD-10-CM | POA: Insufficient documentation

## 2011-01-05 DIAGNOSIS — R109 Unspecified abdominal pain: Secondary | ICD-10-CM | POA: Insufficient documentation

## 2011-01-05 DIAGNOSIS — B9689 Other specified bacterial agents as the cause of diseases classified elsewhere: Secondary | ICD-10-CM | POA: Insufficient documentation

## 2011-01-05 DIAGNOSIS — O2390 Unspecified genitourinary tract infection in pregnancy, unspecified trimester: Secondary | ICD-10-CM | POA: Insufficient documentation

## 2011-01-05 DIAGNOSIS — N760 Acute vaginitis: Secondary | ICD-10-CM | POA: Insufficient documentation

## 2011-01-05 DIAGNOSIS — O209 Hemorrhage in early pregnancy, unspecified: Secondary | ICD-10-CM | POA: Insufficient documentation

## 2011-01-05 DIAGNOSIS — A499 Bacterial infection, unspecified: Secondary | ICD-10-CM | POA: Insufficient documentation

## 2011-01-05 LAB — CBC
MCV: 79.8 fL (ref 78.0–100.0)
Platelets: 185 10*3/uL (ref 150–400)
RBC: 4.31 MIL/uL (ref 3.87–5.11)
RDW: 13.8 % (ref 11.5–15.5)
WBC: 6.8 10*3/uL (ref 4.0–10.5)

## 2011-01-05 LAB — URINALYSIS, ROUTINE W REFLEX MICROSCOPIC
Leukocytes, UA: NEGATIVE
Nitrite: NEGATIVE
Protein, ur: NEGATIVE mg/dL
Specific Gravity, Urine: 1.025 (ref 1.005–1.030)
Urobilinogen, UA: 1 mg/dL (ref 0.0–1.0)

## 2011-01-05 LAB — WET PREP, GENITAL
Trich, Wet Prep: NONE SEEN
Yeast Wet Prep HPF POC: NONE SEEN

## 2011-01-05 MED ORDER — SUCRALFATE 1 GM/10ML PO SUSP: 1.0000 g | Freq: Four times a day (QID) | ORAL | Status: AC

## 2011-01-05 MED ORDER — PROMETHAZINE HCL 25 MG PO TABS: 25.0000 mg | ORAL_TABLET | Freq: Four times a day (QID) | ORAL | Status: AC | PRN

## 2011-01-05 MED ORDER — ONDANSETRON 8 MG PO TBDP: 8.0000 mg | ORAL_TABLET | Freq: Three times a day (TID) | ORAL | Status: AC | PRN

## 2011-01-05 MED ORDER — GI COCKTAIL ~~LOC~~
30.0000 mL | Freq: Once | ORAL | Status: AC
  Administered 2011-01-05: 30 mL via ORAL
  Filled 2011-01-05: qty 30

## 2011-01-05 MED ORDER — METRONIDAZOLE 500 MG PO TABS: 500.0000 mg | ORAL_TABLET | Freq: Two times a day (BID) | ORAL | Status: AC

## 2011-01-05 NOTE — ED Provider Notes (Signed)
Attestation of Attending Supervision of Advanced Practitioner: Evaluation and management procedures were performed by the PA/NP/CNM/OB Fellow under my supervision/collaboration. Chart reviewed and agree with management and plan.  Jaynie Collins A M.D. 01/05/2011 11:53 PM

## 2011-01-05 NOTE — Progress Notes (Signed)
Patient states she was seen in MAU on 8-2 and had an ultrasound that made her 6 weeks. Has not had any prenatal care since that time. Has been having abdominal pain for about 3 weeks and nausea and vomiting. Has a thick vaginal discharge with odor.

## 2011-01-05 NOTE — Progress Notes (Signed)
LLQ pain for 1 1/2 weeks, vomitting "all the time."

## 2011-01-05 NOTE — ED Provider Notes (Signed)
History     Chief Complaint  Patient presents with  . Abdominal Pain   HPI  Pt is [redacted]w[redacted]d pregnant with NOB appointment on Nov 5 with ?[facility].  Pt complains of lower abdominal pain and left lower quadrant pain along with spotting after intercourse for about 1 month and pain with intercourse- left lower quadrant pain.  Pt has also had epigastric pain.  She has also has constipation  And a thick white vaginal discharge with odor.    Past Medical History  Diagnosis Date  . Depression     with Depo Provera  . Previous cesarean section     9 # fetal distress    Past Surgical History  Procedure Date  . Cesarean section     2010    No family history on file.  History  Substance Use Topics  . Smoking status: Never Smoker   . Smokeless tobacco: Not on file  . Alcohol Use: No    Allergies:  Allergies  Allergen Reactions  . Latex Itching    No prescriptions prior to admission    Review of Systems  Constitutional: Negative for fever and chills.  Gastrointestinal: Positive for heartburn, nausea, vomiting, abdominal pain and constipation. Negative for diarrhea.  Genitourinary: Negative for dysuria and urgency.   Physical Exam   Blood pressure 113/64, pulse 82, temperature 99.1 F (37.3 C), temperature source Oral, resp. rate 20, height 5\' 6"  (1.676 m), weight 224 lb 6.4 oz (101.787 kg), last menstrual period 09/05/2010, SpO2 99.00%.  Physical Exam  Vitals reviewed. Constitutional: She is oriented to person, place, and time. She appears well-developed and well-nourished.  HENT:  Head: Normocephalic.  Eyes: Pupils are equal, round, and reactive to light.  Neck: Normal range of motion. Neck supple.  Cardiovascular: Normal rate.   Respiratory: Effort normal.  GI: Soft. She exhibits no distension. There is no tenderness. There is no rebound and no guarding.  Genitourinary:       Mod amount of thick creamy frothy discharge in vaullt; cervix unable to be visualized with  speculum due to habitus but bimanual- closed parous- no bleeding noted; uterus gravid nontender; adnexal without palpable enlargement or tenderness  Musculoskeletal: Normal range of motion.  Neurological: She is alert and oriented to person, place, and time.  Skin: Skin is warm and dry.  Psychiatric: She has a normal mood and affect.    MAU Course  Procedures Urinalysis- normal Wet prep- mod clue cells GC/chlmaydia- pending Limited Ultrasound- normal single living IUP [redacted]w[redacted]d with normal appearing ovaries- placenta above cervical os GI cocktail given for GERD with good results   Assessment and Plan  Abdominal pain in pregnancy Post Coital bleeding Bacterial vaginosis- prescription for Flagyl 500 mg BID for 7 days Nausea and vomiting- Zofran 8 mg and Phenergan 25 tablets prescription Constipation- increase fiber and fruits and fluids- Miralax recommended GERD prescription for Carafate LINEBERRY,SUSAN 01/05/2011, 7:14 PM

## 2013-01-07 ENCOUNTER — Encounter (HOSPITAL_COMMUNITY): Payer: Self-pay

## 2013-01-07 ENCOUNTER — Inpatient Hospital Stay (HOSPITAL_COMMUNITY)
Admission: AD | Admit: 2013-01-07 | Discharge: 2013-01-07 | Disposition: A | Discharge status: Home / Self Care | Payer: Medicaid Other | Source: Ambulatory Visit | Attending: Obstetrics & Gynecology | Admitting: Obstetrics & Gynecology

## 2013-01-07 DIAGNOSIS — R079 Chest pain, unspecified: Secondary | ICD-10-CM | POA: Insufficient documentation

## 2013-01-07 DIAGNOSIS — K5900 Constipation, unspecified: Secondary | ICD-10-CM | POA: Insufficient documentation

## 2013-01-07 HISTORY — DX: Other constipation: K59.09

## 2013-01-07 LAB — URINALYSIS, ROUTINE W REFLEX MICROSCOPIC
Glucose, UA: NEGATIVE mg/dL
Ketones, ur: NEGATIVE mg/dL
Leukocytes, UA: NEGATIVE
pH: 6 (ref 5.0–8.0)

## 2013-01-07 LAB — URINE MICROSCOPIC-ADD ON

## 2013-01-07 MED ORDER — KETOROLAC TROMETHAMINE 30 MG/ML IJ SOLN
30.0000 mg | Freq: Once | INTRAMUSCULAR | Status: AC
  Administered 2013-01-07: 30 mg via INTRAMUSCULAR
  Filled 2013-01-07: qty 1

## 2013-01-07 MED ORDER — IBUPROFEN 600 MG PO TABS: 600.0000 mg | ORAL_TABLET | Freq: Four times a day (QID) | ORAL | Status: AC | PRN

## 2013-01-07 NOTE — MAU Provider Note (Signed)
CC: No chief complaint on file.    First Provider Initiated Contact with Patient 01/07/13 1325      HPI Tiffany Hanna is a 27 y.o. [id number]  who presents with onset of localized left anterior chest pain a week ago coincident with starting prednisone taper for rash . She localizes the pain to the lower left breast radiating to below the right breast. No breast pain. She describes it as sharp and intermittent at times going away completely. The pain is worse since last night. It is nonexertional or related to movement. No hematemesis or relation to eating or empty stomach. She does report having previous similar episodes of chest discomfort in the past for about a year. States she often takes medication for gas with some relief of the pain. She does have anxiety but does not relate the pain to anxiety. LMP 12/31/2012. Denies abnormal bleeding. She reports abdominal bloating, not pain, which has been present and unchanged ever since she had her tubal ligation over a year ago. Though she has not a BM in weeks, she states she is not constipated because her pattern is to have a bowel movement once a month and this is not at all unusual. No nausea vomiting or diarrhea at present. She was nauseated the first day of taking prednisone. The extremity rash is faded and much improved. Review of Systems  Constitutional: Positive for chills. Negative for fever and weight loss.  Respiratory: Negative for cough, shortness of breath and wheezing.   Cardiovascular: Positive for chest pain. Negative for palpitations, orthopnea and PND.  Gastrointestinal: Positive for abdominal pain and constipation. Negative for heartburn, nausea, vomiting and diarrhea.  Genitourinary: Negative for dysuria and urgency.  Musculoskeletal: Negative for myalgias and neck pain.  Skin: Positive for itching and rash.  Neurological: Positive for dizziness and headaches.  Psychiatric/Behavioral: Negative for depression and substance abuse.  The patient is nervous/anxious and has insomnia.      Past Medical History  Diagnosis Date  . Depression     with Depo Provera  . Previous cesarean section     9 # fetal distress  . Constipation, chronic     OB History  Gravida Para Term Preterm AB SAB TAB Ectopic Multiple Living  6 4 4  0 1 0 1 0 0 4    # Outcome Date GA Lbr Len/2nd Weight Sex Delivery Anes PTL Lv  6 GRA              Comments: System Generated. Please review and update pregnancy details.  5 TRM    9 lb 2 oz (4.139 kg)  LTCS        Comments: fetal distress  4 TRM           3 TRM           2 TRM           1 TAB               Past Surgical History  Procedure Laterality Date  . Cesarean section      2010  . Tubal ligation      History   Social History  . Marital Status: Single    Spouse Name: N/A    Number of Children: N/A  . Years of Education: N/A   Occupational History  . Not on file.   Social History Main Topics  . Smoking status: Never Smoker   . Smokeless tobacco: Not on file  . Alcohol  Use: No  . Drug Use: No  . Sexual Activity: Yes    Birth Control/ Protection: Surgical   Other Topics Concern  . Not on file   Social History Narrative  . No narrative on file    No current facility-administered medications on file prior to encounter.   No current outpatient prescriptions on file prior to encounter.    Allergies  Allergen Reactions  . Latex Itching    ROS Pertinent items in HPI  PHYSICAL EXAM Filed Vitals:   01/07/13 1304  BP: 116/74  Pulse: 68  Temp: 98.4 F (36.9 C)  Resp: 16   General: Well nourished, well developed female in apparent distress crying, but was easily distractible and calmed Cardiovascular: Normal rate Respiratory: Normal effort Abdomen: Obese, soft, hypoactive BS, nontender throughout Back: No CVAT Extremities: No edema Neurologic: Alert and oriented Skin: pale confluent rash of arms looks like vitiligo  LAB RESULTS Results for orders  placed during the hospital encounter of 01/07/13 (from the past 24 hour(s))  URINALYSIS, ROUTINE W REFLEX MICROSCOPIC     Status: Abnormal   Collection Time    01/07/13  1:07 PM      Result Value Range   Color, Urine YELLOW  YELLOW   APPearance CLEAR  CLEAR   Specific Gravity, Urine 1.025  1.005 - 1.030   pH 6.0  5.0 - 8.0   Glucose, UA NEGATIVE  NEGATIVE mg/dL   Hgb urine dipstick MODERATE (*) NEGATIVE   Bilirubin Urine NEGATIVE  NEGATIVE   Ketones, ur NEGATIVE  NEGATIVE mg/dL   Protein, ur NEGATIVE  NEGATIVE mg/dL   Urobilinogen, UA 0.2  0.0 - 1.0 mg/dL   Nitrite NEGATIVE  NEGATIVE   Leukocytes, UA NEGATIVE  NEGATIVE  URINE MICROSCOPIC-ADD ON     Status: Abnormal   Collection Time    01/07/13  1:07 PM      Result Value Range   Squamous Epithelial / LPF FEW (*) RARE   WBC, UA 3-6  <3 WBC/hpf   RBC / HPF 0-2  <3 RBC/hpf   Bacteria, UA MANY (*) RARE  POCT PREGNANCY, URINE     Status: None   Collection Time    01/07/13  1:33 PM      Result Value Range   Preg Test, Ur NEGATIVE  NEGATIVE    IMAGING No results found.  MAU COURSE Toradol 30 mg IM> pain resolved.  Elects to leave and try eating rather than stay for GI cocktail  ASSESSMENT  1. Chest pain, unspecified   Probable gastritis related to prednisone course Constipation PLAN Discharge home.  Advised to try antacids or Protonix if pain recurs or continues. Can take ibuprofen/tylenol per recommended dosages.  See AVS for patient education. Explained options for PMD    Medication List    ASK your doctor about these medications       PREDNISONE PO  Take 1 tablet by mouth daily. Called pharmacy to get dose but they were closed          Danae Orleans, CNM 01/07/2013 1:43 PM

## 2013-01-07 NOTE — MAU Note (Signed)
Pt states here for chest and abdominal pain. CP x1week. Points to pain under left breast radiating slightly towards right breast. Micah Flesher to MD office last week for rash. Was given prednisone, and is still taking medication, however since taking prednisone, has felt this pain. Feels like stomach is making chest hurt. Hx constipation. Usually has bm once a month during menstrual cycle. Since getting tubal almost a year ago, has felt like abdomen stays bloated. Denies bleeding or vaginal d/c changes.

## 2013-01-09 LAB — URINE CULTURE: Colony Count: 30000

## 2013-12-31 ENCOUNTER — Emergency Department (HOSPITAL_COMMUNITY)
Admission: EM | Admit: 2013-12-31 | Discharge: 2013-12-31 | Discharge status: Against Medical Advice | Payer: Self-pay | Attending: Emergency Medicine | Admitting: Emergency Medicine

## 2013-12-31 ENCOUNTER — Encounter (HOSPITAL_COMMUNITY): Payer: Self-pay | Admitting: Emergency Medicine

## 2013-12-31 DIAGNOSIS — S199XXA Unspecified injury of neck, initial encounter: Secondary | ICD-10-CM | POA: Insufficient documentation

## 2013-12-31 DIAGNOSIS — Y92410 Unspecified street and highway as the place of occurrence of the external cause: Secondary | ICD-10-CM | POA: Insufficient documentation

## 2013-12-31 DIAGNOSIS — S3992XA Unspecified injury of lower back, initial encounter: Secondary | ICD-10-CM | POA: Insufficient documentation

## 2013-12-31 DIAGNOSIS — Y9389 Activity, other specified: Secondary | ICD-10-CM | POA: Insufficient documentation

## 2013-12-31 NOTE — ED Notes (Signed)
Called for pt, checked in bathrooms. No response.
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
## 2014-01-14 ENCOUNTER — Encounter (HOSPITAL_COMMUNITY): Payer: Self-pay | Admitting: Emergency Medicine

## 2014-10-16 ENCOUNTER — Emergency Department (HOSPITAL_COMMUNITY)
Admission: EM | Admit: 2014-10-16 | Discharge: 2014-10-17 | Disposition: A | Discharge status: Home / Self Care | Payer: Self-pay | Attending: Emergency Medicine | Admitting: Emergency Medicine

## 2014-10-16 ENCOUNTER — Encounter (HOSPITAL_COMMUNITY): Payer: Self-pay | Admitting: *Deleted

## 2014-10-16 ENCOUNTER — Emergency Department (HOSPITAL_COMMUNITY): Payer: Self-pay

## 2014-10-16 DIAGNOSIS — R110 Nausea: Secondary | ICD-10-CM | POA: Insufficient documentation

## 2014-10-16 DIAGNOSIS — R079 Chest pain, unspecified: Secondary | ICD-10-CM | POA: Insufficient documentation

## 2014-10-16 DIAGNOSIS — Z91040 Latex allergy status: Secondary | ICD-10-CM | POA: Insufficient documentation

## 2014-10-16 DIAGNOSIS — R61 Generalized hyperhidrosis: Secondary | ICD-10-CM | POA: Insufficient documentation

## 2014-10-16 DIAGNOSIS — Z8719 Personal history of other diseases of the digestive system: Secondary | ICD-10-CM | POA: Insufficient documentation

## 2014-10-16 DIAGNOSIS — F329 Major depressive disorder, single episode, unspecified: Secondary | ICD-10-CM | POA: Insufficient documentation

## 2014-10-16 LAB — I-STAT TROPONIN, ED: TROPONIN I, POC: 0 ng/mL (ref 0.00–0.08)

## 2014-10-16 NOTE — ED Provider Notes (Signed)
CSN: [id number]     Arrival date & time 10/16/14  2106 History   First MD Initiated Contact with Patient 10/16/14 2126     Chief Complaint  Patient presents with  . Chest Pain     (Consider location/radiation/quality/duration/timing/severity/associated sxs/prior Treatment) Patient is a 29 y.o. female presenting with chest pain.  Chest Pain Pain location:  Substernal area Pain quality: dull ("scraping the bone")   Radiates to: to left chest. Pain radiates to the back: no   Pain severity:  Moderate Duration:  1 week Timing:  Intermittent Progression:  Worsening Context comment:  Worse at night Relieved by: stretching\ Worsened by:  Exertion Ineffective treatments:  Certain positions Associated symptoms: diaphoresis and nausea   Associated symptoms: no abdominal pain, no back pain, no cough, no fever, no headache, no shortness of breath, no syncope and not vomiting   Risk factors: no birth control, no coronary artery disease, no diabetes mellitus, no high cholesterol, no hypertension, no prior DVT/PE, no smoking and no surgery   Risk factors comment:  No smoking, no drug use, no family history   Past Medical History  Diagnosis Date  . Depression     with Depo Provera  . Previous cesarean section     9 # fetal distress  . Constipation, chronic    Past Surgical History  Procedure Laterality Date  . Cesarean section      2010  . Tubal ligation     Family History  Problem Relation Age of Onset  . Hypertension Mother   . HIV Mother   . Diabetes Maternal Grandfather    History  Substance Use Topics  . Smoking status: Never Smoker   . Smokeless tobacco: Not on file  . Alcohol Use: No   OB History    Gravida Para Term Preterm AB TAB SAB Ectopic Multiple Living   6 4 4  0 1 1 0 0 0 4     Review of Systems  Constitutional: Positive for diaphoresis. Negative for fever.  HENT: Negative for sore throat.   Eyes: Negative for visual disturbance.  Respiratory: Negative for  cough and shortness of breath.   Cardiovascular: Positive for chest pain. Negative for syncope.  Gastrointestinal: Positive for nausea. Negative for vomiting, abdominal pain, diarrhea and constipation.  Genitourinary: Negative for difficulty urinating.  Musculoskeletal: Negative for back pain and neck pain.  Skin: Negative for rash.  Neurological: Negative for syncope and headaches.      Allergies  Latex  Home Medications   Prior to Admission medications   Medication Sig Start Date End Date Taking? Authorizing Provider  acetaminophen (TYLENOL) 500 MG tablet Take 1,000 mg by mouth every 6 (six) hours as needed for moderate pain.   Yes Historical Provider, MD  Aspirin-Acetaminophen-Caffeine (GOODY HEADACHE PO) Take 1 Package by mouth as needed (for pain).   Yes Historical Provider, MD  ibuprofen (ADVIL,MOTRIN) 600 MG tablet Take 1 tablet (600 mg total) by mouth every 6 (six) hours as needed for pain. 01/07/13   Deirdre C Poe, CNM   BP 115/67 mmHg  Pulse 64  Temp(Src) 98.5 F (36.9 C) (Oral)  Resp 18  SpO2 99%  LMP 09/28/2014 Physical Exam  Constitutional: She is oriented to person, place, and time. She appears well-developed and well-nourished. No distress.  HENT:  Head: Normocephalic and atraumatic.  Eyes: Conjunctivae and EOM are normal.  Neck: Normal range of motion.  Cardiovascular: Normal rate, regular rhythm, normal heart sounds and intact distal pulses.  Exam reveals  no gallop and no friction rub.   No murmur heard. Pulmonary/Chest: Effort normal and breath sounds normal. No respiratory distress. She has no wheezes. She has no rales. She exhibits tenderness.  Abdominal: Soft. She exhibits no distension. There is no tenderness. There is no guarding.  Musculoskeletal: She exhibits no edema or tenderness.  Neurological: She is alert and oriented to person, place, and time.  Skin: Skin is warm and dry. No rash noted. She is not diaphoretic. No erythema.  Nursing note and  vitals reviewed.   ED Course  Procedures (including critical care time) Labs Review Labs Reviewed  COMPREHENSIVE METABOLIC PANEL - Abnormal; Notable for the following:    ALT 10 (*)    All other components within normal limits  CBC  I-STAT TROPOININ, ED  Rosezena Sensor, ED    Imaging Review Dg Chest 2 View  10/16/2014   CLINICAL DATA:  Patient with mid sternal chest pain radiating to the left chest.  EXAM: CHEST  2 VIEW  COMPARISON:  None.  FINDINGS: The heart size and mediastinal contours are within normal limits. Both lungs are clear. The visualized skeletal structures are unremarkable.  IMPRESSION: No active cardiopulmonary disease.   Electronically Signed   By: Annia Belt M.D.   On: 10/16/2014 22:00     EKG Interpretation   Date/Time:  Wednesday October 16 2014 21:10:19 EDT Ventricular Rate:  74 PR Interval:  146 QRS Duration: 78 QT Interval:  370 QTC Calculation: 410 R Axis:   54 Text Interpretation:  Normal sinus rhythm Normal ECG No previous ECGs  available Confirmed by [facility] MD, Akaylah Lalley ([id number]) on 10/16/2014 10:51:37 PM      MDM   Final diagnoses:  Chest pain, unspecified chest pain type   29 year old female with no significant medical history presents with concern of chest pain. Differential diagnosis for chest pain includes pulmonary embolus, dissection, pneumothorax, pneumonia, ACS, myocarditis, pericarditis.  EKG was done and evaluate by me and showed no acute ST changes and no signs of pericarditis. Chest x-ray was done and evaluated by me and radiology and showed no sign of pneumonia or pneumothorax. Patient is PERC negative and low risk [place] and have low suspicion for PE.  Patient is low risk HEART score with negative delta troponins. Given this evaluation, history and physical have low suspicion for pulmonary embolus, pneumonia, ACS, myocarditis, pericarditis, dissection.   Patient possibly with musculoskeletal chest pain given pain with palpation as well  as her movements. Given ibuprofen for pain in the ED.  Given protonix as well, and discussed need for PCP follow up.  She has understanding of reasons to return and recommend close PCP follow up.    Alvira Monday, MD 10/17/14 [phone]

## 2014-10-16 NOTE — ED Notes (Signed)
The pt is c/o lt lower chest pain for one week just under her lt breast.  No previous history.  lmp July 16th

## 2014-10-16 NOTE — ED Notes (Signed)
Patient transported to X-ray 

## 2014-10-17 LAB — COMPREHENSIVE METABOLIC PANEL
ALK PHOS: 41 U/L (ref 38–126)
ALT: 10 U/L — ABNORMAL LOW (ref 14–54)
AST: 17 U/L (ref 15–41)
Albumin: 3.7 g/dL (ref 3.5–5.0)
Anion gap: 8 (ref 5–15)
BILIRUBIN TOTAL: 0.5 mg/dL (ref 0.3–1.2)
BUN: 11 mg/dL (ref 6–20)
CO2: 24 mmol/L (ref 22–32)
Calcium: 9.1 mg/dL (ref 8.9–10.3)
Chloride: 104 mmol/L (ref 101–111)
Creatinine, Ser: 0.97 mg/dL (ref 0.44–1.00)
GFR calc Af Amer: >60 mL/min (ref >60)
Glucose, Bld: 99 mg/dL (ref 65–99)
Potassium: 4 mmol/L (ref 3.5–5.1)
Sodium: 136 mmol/L (ref 135–145)
Total Protein: 6.6 g/dL (ref 6.5–8.1)

## 2014-10-17 LAB — CBC
HEMATOCRIT: 37.2 % (ref 36.0–46.0)
Hemoglobin: 12.2 g/dL (ref 12.0–15.0)
MCH: 26.1 pg (ref 26.0–34.0)
MCHC: 32.8 g/dL (ref 30.0–36.0)
MCV: 79.5 fL (ref 78.0–100.0)
Platelets: 229 10*3/uL (ref 150–400)
RBC: 4.68 MIL/uL (ref 3.87–5.11)
RDW: 14.2 % (ref 11.5–15.5)
WBC: 6.5 10*3/uL (ref 4.0–10.5)

## 2014-10-17 LAB — I-STAT TROPONIN, ED: Troponin i, poc: 0 ng/mL (ref 0.00–0.08)

## 2014-10-17 MED ORDER — IBUPROFEN 200 MG PO TABS
600.0000 mg | ORAL_TABLET | Freq: Once | ORAL | Status: AC
  Administered 2014-10-17: 600 mg via ORAL
  Filled 2014-10-17: qty 3

## 2014-10-17 MED ORDER — PANTOPRAZOLE SODIUM 40 MG PO TBEC
40.0000 mg | DELAYED_RELEASE_TABLET | Freq: Once | ORAL | Status: AC
  Administered 2014-10-17: 40 mg via ORAL
  Filled 2014-10-17: qty 1

## 2014-10-17 NOTE — Discharge Instructions (Signed)
Chest Pain (Nonspecific) °It is often hard to give a specific diagnosis for the cause of chest pain. There is always a chance that your pain could be related to something serious, such as a heart attack or a blood clot in the lungs. You need to follow up with your health care provider for further evaluation. °CAUSES  °· Heartburn. °· Pneumonia or bronchitis. °· Anxiety or stress. °· Inflammation around your heart (pericarditis) or lung (pleuritis or pleurisy). °· A blood clot in the lung. °· A collapsed lung (pneumothorax). It can develop suddenly on its own (spontaneous pneumothorax) or from trauma to the chest. °· Shingles infection (herpes zoster virus). °The chest wall is composed of bones, muscles, and cartilage. Any of these can be the source of the pain. °· The bones can be bruised by injury. °· The muscles or cartilage can be strained by coughing or overwork. °· The cartilage can be affected by inflammation and become sore (costochondritis). °DIAGNOSIS  °Lab tests or other studies may be needed to find the cause of your pain. Your health care provider may have you take a test called an ambulatory electrocardiogram (ECG). An ECG records your heartbeat patterns over a 24-hour period. You may also have other tests, such as: °· Transthoracic echocardiogram (TTE). During echocardiography, sound waves are used to evaluate how blood flows through your heart. °· Transesophageal echocardiogram (TEE). °· Cardiac monitoring. This allows your health care provider to monitor your heart rate and rhythm in real time. °· Holter monitor. This is a portable device that records your heartbeat and can help diagnose heart arrhythmias. It allows your health care provider to track your heart activity for several days, if needed. °· Stress tests by exercise or by giving medicine that makes the heart beat faster. °TREATMENT  °· Treatment depends on what may be causing your chest pain. Treatment may include: °¨ Acid blockers for  heartburn. °¨ Anti-inflammatory medicine. °¨ Pain medicine for inflammatory conditions. °¨ Antibiotics if an infection is present. °· You may be advised to change lifestyle habits. This includes stopping smoking and avoiding alcohol, caffeine, and chocolate. °· You may be advised to keep your head raised (elevated) when sleeping. This reduces the chance of acid going backward from your stomach into your esophagus. °Most of the time, nonspecific chest pain will improve within 2-3 days with rest and mild pain medicine.  °HOME CARE INSTRUCTIONS  °· If antibiotics were prescribed, take them as directed. Finish them even if you start to feel better. °· For the next few days, avoid physical activities that bring on chest pain. Continue physical activities as directed. °· Do not use any tobacco products, including cigarettes, chewing tobacco, or electronic cigarettes. °· Avoid drinking alcohol. °· Only take medicine as directed by your health care provider. °· Follow your health care provider's suggestions for further testing if your chest pain does not go away. °· Keep any follow-up appointments you made. If you do not go to an appointment, you could develop lasting (chronic) problems with pain. If there is any problem keeping an appointment, call to reschedule. °SEEK MEDICAL CARE IF:  °· Your chest pain does not go away, even after treatment. °· You have a rash with blisters on your chest. °· You have a fever. °SEEK IMMEDIATE MEDICAL CARE IF:  °· You have increased chest pain or pain that spreads to your arm, neck, jaw, back, or abdomen. °· You have shortness of breath. °· You have an increasing cough, or you cough   up blood. °· You have severe back or abdominal pain. °· You feel nauseous or vomit. °· You have severe weakness. °· You faint. °· You have chills. °This is an emergency. Do not wait to see if the pain will go away. Get medical help at once. Call your local emergency services (911 in [place] Do not drive  yourself to the hospital. °MAKE SURE YOU:  °· Understand these instructions. °· Will watch your condition. °· Will get help right away if you are not doing well or get worse. °Document Released: 12/09/2004 Document Revised: 03/06/2013 Document Reviewed: 10/05/2007 °ExitCare® Patient Information ©2015 [employer]. This information is not intended to replace advice given to you by your health care provider. Make sure you discuss any questions you have with your health care provider. ° ° °Emergency Department Resource Guide °1) Find a Doctor and Pay Out of Pocket °Although you won't have to find out who is covered by your insurance plan, it is a good idea to ask around and get recommendations. You will then need to call the office and see if the doctor you have chosen will accept you as a new patient and what types of options they offer for patients who are self-pay. Some doctors offer discounts or will set up payment plans for their patients who do not have insurance, but you will need to ask so you aren't surprised when you get to your appointment. ° °2) Contact Your Local Health Department °Not all health departments have doctors that can see patients for sick visits, but many do, so it is worth a call to see if yours does. If you don't know where your local health department is, you can check in your phone book. The CDC also has a tool to help you locate your state's health department, and many state websites also have listings of all of their local health departments. ° °3) Find a Walk-in Clinic °If your illness is not likely to be very severe or complicated, you may want to try a walk in clinic. These are popping up all over the country in pharmacies, drugstores, and shopping centers. They're usually staffed by nurse practitioners or physician assistants that have been trained to treat common illnesses and complaints. They're usually fairly quick and inexpensive. However, if you have serious medical issues or  chronic medical problems, these are probably not your best option. ° °No Primary Care Doctor: °- Call [facility] at  832-8000 - they can help you locate a primary care doctor that  accepts your insurance, provides certain services, etc. °- Physician Referral Service- 1-800-533-3463 ° °Chronic Pain Problems: °Organization         Address  Phone   Notes  °[facility]  (336) 297-2271 Patients need to be referred by their primary care doctor.  ° °Medication Assistance: °Organization         Address  Phone   Notes  °[place] [facility] [address] °[place], [place] [address] (336) 641-8030 --Must be a resident of [place] °-- Must have NO insurance coverage whatsoever (no Medicaid/ Medicare, etc.) °-- The pt. MUST have a primary care doctor that directs their care regularly and follows them in the community °  °[facility]  (866) 331-1348   °[employer]  (888) 892-1162   ° °Agencies that provide inexpensive medical care: °Organization         Address  Phone   Notes  °[facility]  (  336) 832-8035   °[facility]    (336) 832-7272   °[facility] [address] °[place], [place] [address] (336) 832-4777   °[facility] [address], °[place] (336) 271-4999   °[facility]    (336) 373-0678   °[facility]    (336) 272-1050   °[facility] ° [address], [place] Phone:  (336) 832-4444, Fax:  (336) 832-4440 Hours of Operation:  9 am - 6 pm, M-F.  Also accepts Medicaid/Medicare and self-pay.  °[facility] ° [address], [place] Phone: (336) 832-3150, Fax: (336) 832-3151. Hours of Operation:  8:30 am - 5:30 pm, M-F.  Also accepts Medicaid and self-pay.  °[facility] [place] [address], [place] Phone: (336) 878-6027   °[facility] [address], [place], [place]  (336)723-1848, Ext. 123 Mondays & Thursdays: 7-9 AM.  First 15 patients are seen on a first come, first serve basis. °  ° °Medicaid-accepting [place] Providers: ° °Organization         Address  Phone   Notes  °[facility] [address], [place] (336) 641-2100 Also accepts self-pay patients.  °[facility] [address], [place] ° (336) 856-9996   °[facility] [address], [place] (336) 288-8857   °[facility] [address] [place] [address], [place] (336) 299-7000   °Veita Bland [address], [place]  ° (336) 373-1557 Only accepts [place] Access Medicaid patients after they have their name applied to their card.  ° °Self-Pay (no insurance) in [place]: ° °Organization         Address  Phone   Notes  °Sickle Cell Patients, [facility] [address], [place] (336) 832-1970   °[facility] [address], [place] (336) 832-4400   °[facility] ° [address],  (336) 992-4800   °[facility]/Dr. Osei-Bonsu ° [address] [place] [address], [place] or [address], [place] (336) 841-8500 Phone number for both [place] and [place] locations is the same.  °[facility] [address], [place] (336) 299-0000   °[facility] [address] [place] [address], [place] or [address] (336) 852-7530 °(336) 878-2260   °[facility] [address], [place] (336) 350-1642, phone; (336) 294-5005, fax Sees patients 1st and 3rd Saturday of every month.  Must not qualify for public or private insurance (i.e. Medicaid, Medicare, [place] Health Choice, Veterans' Benefits) • Household income should be no more than 200% of the poverty level •The clinic cannot treat you if you are pregnant or think you are pregnant • Sexually transmitted  diseases are not treated at the clinic.  ° ° °Dental Care: °Organization         Address  Phone  Notes  °[facility] [facility] [address],  (336) 641-6152 Accepts children up to age 21 who are enrolled in Medicaid or [place] Health Choice; pregnant women with a Medicaid card; and children who have applied for Medicaid or [place] Health Choice, but were declined, whose parents can pay a reduced fee at time of service.  °[facility] [place]    [address], [place] (336) 641-7733 Accepts children up to age 21 who are enrolled in Medicaid or [place] Health Choice; pregnant women with a Medicaid card; and children who have applied for Medicaid or [place] Health Choice, but were declined, whose parents can pay a reduced fee at time of service.  °[facility] ° [address], [place] (336) 641-4533 Patients are seen by appointment only. Walk-ins are not accepted. [facility] will see patients 18 years of age and older. °Monday - Tuesday (8am-5pm) °Most Wednesdays (8:30-5pm) °$30 per visit, cash only  °[facility] ° [address], [place] (336) 641-4533 Patients are seen by appointment only. Walk-ins are not accepted. [facility] will see patients 18 years of age and older. °One Wednesday Evening (Monthly: Volunteer Based).  $30 per visit, cash only  °[facility]  (919) 537-3737 for adults; Children under age 4, call Graduate Pediatric Dentistry at (919) [phone]. Children aged 4-14, please call (919) [phone] to request a pediatric application. ° Dental services are provided in all areas of dental care including fillings, crowns and bridges, complete and partial dentures, implants, gum treatment, root canals, and extractions. Preventive care is also provided. Treatment is provided to both adults and children. °Patients are selected via a  lottery and there is often a waiting list. °  °[facility] [address], °[place] ° (336) 763-8833 [email or web address] °  °[facility] [address], [place], [place] (336)723-1848, Ext. 123 Second and Fourth Thursday of each month, opens at 6:30 AM; Clinic ends at 9 AM.  Patients are seen on a first-come first-served basis, and a limited number are seen during each clinic.  ° °[facility] ° [address], [place], [place] (336) 723-7904   Eligibility Requirements °You must have lived in [place], [place], or [place] counties for at least the last three months. °  You cannot be eligible for state or federal sponsored healthcare insurance, including Veterans Administration, Medicaid, or Medicare. °  You generally cannot be eligible for healthcare insurance through your employer.  °  How to apply: °Eligibility screenings are held every Tuesday and Wednesday afternoon from 1:00 pm until 4:00 pm. You do not need an appointment for the interview!  °[facility] [address], [place], [place] 336-631-2330   °[facility]  336-342-8273   °[facility]  336-703-3100   °[facility]  336-570-6415   ° °Behavioral Health Resources in the Community: °Intensive Outpatient Programs °Organization         Address  Phone  Notes  °[facility] [address], [place], [place] 336-878-6098   °[facility] [address], [place], [place] 336-832-9800   °[facility] [address], [place], [place] ° 336-882-2125   °[facility] [address],  °[place],  1-800-853-5163 or 336-641-4981   °Substance Abuse Resources °Organization         Address  Phone  Notes  °[facility]  336-882-2125   °[facility]  336-784-9470   °[facility]  336-285-9073   °[facility]  336-845-3988   °Residential &  Outpatient Substance Abuse Program  1-800-659-3381   °Psychological Services °Organization         Address  Phone  Notes  °[facility]  336- 832-9600   °  [facility]  336- 378-7881   °[facility] [address], [place] 1-800-853-5163 or 336-641-4981   ° °Mobile Crisis Teams °Organization         Address  Phone  Notes  °[facility], Mobile Crisis Care Unit  1-877-626-1772   °Assertive °[facility] ° [address] [place], [place] 336-834-9664   °Sharon DeEsch [address] °[place] 336-554-5454   ° °Self-Help/Support Groups °Organization         Address  Phone             Notes  °[facility] [place] [facility] - variety of support groups  336- 373-1402 Call for more information  °Narcotics Anonymous (NA), [facility] [address], °[place]  2 meetings at this location  ° °Residential Treatment Programs °Organization         Address  Phone  Notes  °[facility] [address],    °[place]  1-866-801-8205   °[facility] ° [address], [place], [place] 704-293-8524   °[facility] [address], [place] 336-845-3988 Admissions: 8am-3pm M-F  °[facility] [address]    °[place], [place] 336-841-1104   °[facility] [address], [place], [place] 336-379-7146   °[facility] [address]  °[place], [place] 336-285-9073   °[facility] Intensive Outpatient [address], [place], [place] 336-852-3033   °[facility] [address]  °[place], [place] 1-877-615-2722 or 336-784-9470   °[facility] [address] [place], [place] 336-227-7417 Accepts Medicaid  °[facility] [address]  °[place] 1-800-659-3381 Substance Abuse/Addiction Treatment  ° °[place] Behavioral Health Resources °Organization          Address  Phone  Notes  °[facility]  (888) 581-9988   °Julie Brannon, PhD [address] [place], [place]   (336) 349-5553 or (336) 951-0000   °[facility]   [address] °[place], [place] (336) 349-4454   °[facility] [address], [place], [place] (336) 342-8316 Insurance/Medicaid/sponsorship through [facility]  °[facility] [address]                                    [place], [place] (336) 342-8316 Therapy/tele-psych/case  °[facility] [address]  ° [place], [place] (336) 349-2233    °Dr. Arfeen  (336) 349-4544   °[facility] [place]  [employer] [place] [facility] 1) [address], [place] °2) [address], [place] °3)  [address], [place] (336) 349-3220 °(336) 342-7768 ° °(336) 342-8140   °[place] [facility] (336) 342-1394 or (336) 342-3537 (After Hours)    ° ° ° °

## 2014-10-17 NOTE — ED Notes (Signed)
Pt left with all belongings and ambulated out of the treatment area.  

## 2015-06-18 ENCOUNTER — Encounter: Payer: Self-pay | Admitting: Emergency Medicine

## 2015-06-18 ENCOUNTER — Emergency Department
Admission: EM | Admit: 2015-06-18 | Discharge: 2015-06-18 | Disposition: A | Discharge status: Home / Self Care | Payer: Medicaid Other | Attending: Emergency Medicine | Admitting: Emergency Medicine

## 2015-06-18 DIAGNOSIS — Y92410 Unspecified street and highway as the place of occurrence of the external cause: Secondary | ICD-10-CM | POA: Insufficient documentation

## 2015-06-18 DIAGNOSIS — Y9389 Activity, other specified: Secondary | ICD-10-CM | POA: Diagnosis not present

## 2015-06-18 DIAGNOSIS — M545 Low back pain: Secondary | ICD-10-CM | POA: Diagnosis present

## 2015-06-18 DIAGNOSIS — S39012A Strain of muscle, fascia and tendon of lower back, initial encounter: Secondary | ICD-10-CM

## 2015-06-18 DIAGNOSIS — Y999 Unspecified external cause status: Secondary | ICD-10-CM | POA: Insufficient documentation

## 2015-06-18 MED ORDER — IBUPROFEN 600 MG PO TABS: 600.0000 mg | ORAL_TABLET | Freq: Four times a day (QID) | ORAL | Status: AC | PRN

## 2015-06-18 MED ORDER — CYCLOBENZAPRINE HCL 5 MG PO TABS: 5.0000 mg | ORAL_TABLET | Freq: Three times a day (TID) | ORAL | Status: AC | PRN
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
## 2015-06-18 NOTE — Discharge Instructions (Signed)
Cryotherapy °Cryotherapy means treatment with cold. Ice or gel packs can be used to reduce both pain and swelling. Ice is the most helpful within the first 24 to 48 hours after an injury or flare-up from overusing a muscle or joint. Sprains, strains, spasms, burning pain, shooting pain, and aches can all be eased with ice. Ice can also be used when recovering from surgery. Ice is effective, has very few side effects, and is safe for most people to use. °PRECAUTIONS  °Ice is not a safe treatment option for people with: °· Raynaud phenomenon. This is a condition affecting small blood vessels in the extremities. Exposure to cold may cause your problems to return. °· Cold hypersensitivity. There are many forms of cold hypersensitivity, including: °· Cold urticaria. Red, itchy hives appear on the skin when the tissues begin to warm after being iced. °· Cold erythema. This is a red, itchy rash caused by exposure to cold. °· Cold hemoglobinuria. Red blood cells break down when the tissues begin to warm after being iced. The hemoglobin that carry oxygen are passed into the urine because they cannot combine with blood proteins fast enough. °· Numbness or altered sensitivity in the area being iced. °If you have any of the following conditions, do not use ice until you have discussed cryotherapy with your caregiver: °· Heart conditions, such as arrhythmia, angina, or chronic heart disease. °· High blood pressure. °· Healing wounds or open skin in the area being iced. °· Current infections. °· Rheumatoid arthritis. °· Poor circulation. °· Diabetes. °Ice slows the blood flow in the region it is applied. This is beneficial when trying to stop inflamed tissues from spreading irritating chemicals to surrounding tissues. However, if you expose your skin to cold temperatures for too long or without the proper protection, you can damage your skin or nerves. Watch for signs of skin damage due to cold. °HOME CARE INSTRUCTIONS °Follow  these tips to use ice and cold packs safely. °· Place a dry or damp towel between the ice and skin. A damp towel will cool the skin more quickly, so you may need to shorten the time that the ice is used. °· For a more rapid response, add gentle compression to the ice. °· Ice for no more than 10 to 20 minutes at a time. The bonier the area you are icing, the less time it will take to get the benefits of ice. °· Check your skin after 5 minutes to make sure there are no signs of a poor response to cold or skin damage. °· Rest 20 minutes or more between uses. °· Once your skin is numb, you can end your treatment. You can test numbness by very lightly touching your skin. The touch should be so light that you do not see the skin dimple from the pressure of your fingertip. When using ice, most people will feel these normal sensations in this order: cold, burning, aching, and numbness. °· Do not use ice on someone who cannot communicate their responses to pain, such as small children or people with dementia. °HOW TO MAKE AN ICE PACK °Ice packs are the most common way to use ice therapy. Other methods include ice massage, ice baths, and cryosprays. Muscle creams that cause a cold, tingly feeling do not offer the same benefits that ice offers and should not be used as a substitute unless recommended by your caregiver. °To make an ice pack, do one of the following: °· Place crushed ice or a   bag of frozen vegetables in a sealable plastic bag. Squeeze out the excess air. Place this bag inside another plastic bag. Slide the bag into a pillowcase or place a damp towel between your skin and the bag.  Mix 3 parts water with 1 part rubbing alcohol. Freeze the mixture in a sealable plastic bag. When you remove the mixture from the freezer, it will be slushy. Squeeze out the excess air. Place this bag inside another plastic bag. Slide the bag into a pillowcase or place a damp towel between your skin and the bag. SEEK MEDICAL CARE  IF:  You develop white spots on your skin. This may give the skin a blotchy (mottled) appearance.  Your skin turns blue or pale.  Your skin becomes waxy or hard.  Your swelling gets worse. MAKE SURE YOU:   Understand these instructions.  Will watch your condition.  Will get help right away if you are not doing well or get worse.   This information is not intended to replace advice given to you by your health care provider. Make sure you discuss any questions you have with your health care provider.   Document Released: 10/26/2010 Document Revised: 03/22/2014 Document Reviewed: 10/26/2010 [employer] Interactive Patient Education 2016 [employer] Strain With Rehab A strain is an injury in which a tendon or muscle is torn. The muscles and tendons of the lower back are vulnerable to strains. However, these muscles and tendons are very strong and require a great force to be injured. Strains are classified into three categories. Grade 1 strains cause pain, but the tendon is not lengthened. Grade 2 strains include a lengthened ligament, due to the ligament being stretched or partially ruptured. With grade 2 strains there is still function, although the function may be decreased. Grade 3 strains involve a complete tear of the tendon or muscle, and function is usually impaired. SYMPTOMS   Pain in the lower back.  Pain that affects one side more than the other.  Pain that gets worse with movement and may be felt in the hip, buttocks, or back of the thigh.  Muscle spasms of the muscles in the back.  Swelling along the muscles of the back.  Loss of strength of the back muscles.  Crackling sound (crepitation) when the muscles are touched. CAUSES  Lower back strains occur when a force is placed on the muscles or tendons that is greater than they can handle. Common causes of injury include:  Prolonged overuse of the muscle-tendon units in the lower back, usually from incorrect  posture.  A single violent injury or force applied to the back. RISK INCREASES WITH:  Sports that involve twisting forces on the spine or a lot of bending at the waist (football, rugby, weightlifting, bowling, golf, tennis, speed skating, racquetball, swimming, running, gymnastics, diving).  Poor strength and flexibility.  Failure to warm up properly before activity.  Family history of lower back pain or disk disorders.  Previous back injury or surgery (especially fusion).  Poor posture with lifting, especially heavy objects.  Prolonged sitting, especially with poor posture. PREVENTION   Learn and use proper posture when sitting or lifting (maintain proper posture when sitting, lift using the knees and legs, not at the waist).  Warm up and stretch properly before activity.  Allow for adequate recovery between workouts.  Maintain physical fitness:  Strength, flexibility, and endurance.  Cardiovascular fitness. PROGNOSIS  If treated properly, lower back strains usually heal within 6 weeks. RELATED  COMPLICATIONS   Recurring symptoms, resulting in a chronic problem.  Chronic inflammation, scarring, and partial muscle-tendon tear.  Delayed healing or resolution of symptoms.  Prolonged disability. TREATMENT  Treatment first involves the use of ice and medicine, to reduce pain and inflammation. The use of strengthening and stretching exercises may help reduce pain with activity. These exercises may be performed at home or with a therapist. Severe injuries may require referral to a therapist for further evaluation and treatment, such as ultrasound. Your caregiver may advise that you wear a back brace or corset, to help reduce pain and discomfort. Often, prolonged bed rest results in greater harm then benefit. Corticosteroid injections may be recommended. However, these should be reserved for the most serious cases. It is important to avoid using your back when lifting objects. At  night, sleep on your back on a firm mattress with a pillow placed under your knees. If non-surgical treatment is unsuccessful, surgery may be needed.  MEDICATION   If pain medicine is needed, nonsteroidal anti-inflammatory medicines (aspirin and ibuprofen), or other minor pain relievers (acetaminophen), are often advised.  Do not take pain medicine for 7 days before surgery.  Prescription pain relievers may be given, if your caregiver thinks they are needed. Use only as directed and only as much as you need.  Ointments applied to the skin may be helpful.  Corticosteroid injections may be given by your caregiver. These injections should be reserved for the most serious cases, because they may only be given a certain number of times. HEAT AND COLD  Cold treatment (icing) should be applied for 10 to 15 minutes every 2 to 3 hours for inflammation and pain, and immediately after activity that aggravates your symptoms. Use ice packs or an ice massage.  Heat treatment may be used before performing stretching and strengthening activities prescribed by your caregiver, physical therapist, or athletic trainer. Use a heat pack or a warm water soak. SEEK MEDICAL CARE IF:   Symptoms get worse or do not improve in 2 to 4 weeks, despite treatment.  You develop numbness, weakness, or loss of bowel or bladder function.  New, unexplained symptoms develop. (Drugs used in treatment may produce side effects.) EXERCISES  RANGE OF MOTION (ROM) AND STRETCHING EXERCISES - Low Back Strain Most people with lower back pain will find that their symptoms get worse with excessive bending forward (flexion) or arching at the lower back (extension). The exercises which will help resolve your symptoms will focus on the opposite motion.  Your physician, physical therapist or athletic trainer will help you determine which exercises will be most helpful to resolve your lower back pain. Do not complete any exercises without  first consulting with your caregiver. Discontinue any exercises which make your symptoms worse until you speak to your caregiver.  If you have pain, numbness or tingling which travels down into your buttocks, leg or foot, the goal of the therapy is for these symptoms to move closer to your back and eventually resolve. Sometimes, these leg symptoms will get better, but your lower back pain may worsen. This is typically an indication of progress in your rehabilitation. Be very alert to any changes in your symptoms and the activities in which you participated in the 24 hours prior to the change. Sharing this information with your caregiver will allow him/her to most efficiently treat your condition.  These exercises may help you when beginning to rehabilitate your injury. Your symptoms may resolve with or without further involvement from  your physician, physical therapist or athletic trainer. While completing these exercises, remember:  Restoring tissue flexibility helps normal motion to return to the joints. This allows healthier, less painful movement and activity.  An effective stretch should be held for at least 30 seconds.  A stretch should never be painful. You should only feel a gentle lengthening or release in the stretched tissue. FLEXION RANGE OF MOTION AND STRETCHING EXERCISES: STRETCH - Flexion, Single Knee to Chest   Lie on a firm bed or floor with both legs extended in front of you.  Keeping one leg in contact with the floor, bring your opposite knee to your chest. Hold your leg in place by either grabbing behind your thigh or at your knee.  Pull until you feel a gentle stretch in your lower back. Hold __________ seconds.  Slowly release your grasp and repeat the exercise with the opposite side. Repeat __________ times. Complete this exercise __________ times per day.  STRETCH - Flexion, Double Knee to Chest   Lie on a firm bed or floor with both legs extended in front of  you.  Keeping one leg in contact with the floor, bring your opposite knee to your chest.  Tense your stomach muscles to support your back and then lift your other knee to your chest. Hold your legs in place by either grabbing behind your thighs or at your knees.  Pull both knees toward your chest until you feel a gentle stretch in your lower back. Hold __________ seconds.  Tense your stomach muscles and slowly return one leg at a time to the floor. Repeat __________ times. Complete this exercise __________ times per day.  STRETCH - Low Trunk Rotation  Lie on a firm bed or floor. Keeping your legs in front of you, bend your knees so they are both pointed toward the ceiling and your feet are flat on the floor.  Extend your arms out to the side. This will stabilize your upper body by keeping your shoulders in contact with the floor.  Gently and slowly drop both knees together to one side until you feel a gentle stretch in your lower back. Hold for __________ seconds.  Tense your stomach muscles to support your lower back as you bring your knees back to the starting position. Repeat the exercise to the other side. Repeat __________ times. Complete this exercise __________ times per day  EXTENSION RANGE OF MOTION AND FLEXIBILITY EXERCISES: STRETCH - Extension, Prone on Elbows   Lie on your stomach on the floor, a bed will be too soft. Place your palms about shoulder width apart and at the height of your head.  Place your elbows under your shoulders. If this is too painful, stack pillows under your chest.  Allow your body to relax so that your hips drop lower and make contact more completely with the floor.  Hold this position for __________ seconds.  Slowly return to lying flat on the floor. Repeat __________ times. Complete this exercise __________ times per day.  RANGE OF MOTION - Extension, Prone Press Ups  Lie on your stomach on the floor, a bed will be too soft. Place your palms  about shoulder width apart and at the height of your head.  Keeping your back as relaxed as possible, slowly straighten your elbows while keeping your hips on the floor. You may adjust the placement of your hands to maximize your comfort. As you gain motion, your hands will come more underneath your shoulders.  Hold this  position __________ seconds.  Slowly return to lying flat on the floor. Repeat __________ times. Complete this exercise __________ times per day.  RANGE OF MOTION- Quadruped, Neutral Spine   Assume a hands and knees position on a firm surface. Keep your hands under your shoulders and your knees under your hips. You may place padding under your knees for comfort.  Drop your head and point your tail bone toward the ground below you. This will round out your lower back like an angry cat. Hold this position for __________ seconds.  Slowly lift your head and release your tail bone so that your back sags into a large arch, like an old horse.  Hold this position for __________ seconds.  Repeat this until you feel limber in your lower back.  Now, find your "sweet spot." This will be the most comfortable position somewhere between the two previous positions. This is your neutral spine. Once you have found this position, tense your stomach muscles to support your lower back.  Hold this position for __________ seconds. Repeat __________ times. Complete this exercise __________ times per day.  STRENGTHENING EXERCISES - Low Back Strain These exercises may help you when beginning to rehabilitate your injury. These exercises should be done near your "sweet spot." This is the neutral, low-back arch, somewhere between fully rounded and fully arched, that is your least painful position. When performed in this safe range of motion, these exercises can be used for people who have either a flexion or extension based injury. These exercises may resolve your symptoms with or without further  involvement from your physician, physical therapist or athletic trainer. While completing these exercises, remember:   Muscles can gain both the endurance and the strength needed for everyday activities through controlled exercises.  Complete these exercises as instructed by your physician, physical therapist or athletic trainer. Increase the resistance and repetitions only as guided.  You may experience muscle soreness or fatigue, but the pain or discomfort you are trying to eliminate should never worsen during these exercises. If this pain does worsen, stop and make certain you are following the directions exactly. If the pain is still present after adjustments, discontinue the exercise until you can discuss the trouble with your caregiver. STRENGTHENING - Deep Abdominals, Pelvic Tilt  Lie on a firm bed or floor. Keeping your legs in front of you, bend your knees so they are both pointed toward the ceiling and your feet are flat on the floor.  Tense your lower abdominal muscles to press your lower back into the floor. This motion will rotate your pelvis so that your tail bone is scooping upwards rather than pointing at your feet or into the floor.  With a gentle tension and even breathing, hold this position for __________ seconds. Repeat __________ times. Complete this exercise __________ times per day.  STRENGTHENING - Abdominals, Crunches   Lie on a firm bed or floor. Keeping your legs in front of you, bend your knees so they are both pointed toward the ceiling and your feet are flat on the floor. Cross your arms over your chest.  Slightly tip your chin down without bending your neck.  Tense your abdominals and slowly lift your trunk high enough to just clear your shoulder blades. Lifting higher can put excessive stress on the lower back and does not further strengthen your abdominal muscles.  Control your return to the starting position. Repeat __________ times. Complete this exercise  __________ times per day.  STRENGTHENING - Quadruped,  Opposite UE/LE Lift   Assume a hands and knees position on a firm surface. Keep your hands under your shoulders and your knees under your hips. You may place padding under your knees for comfort.  Find your neutral spine and gently tense your abdominal muscles so that you can maintain this position. Your shoulders and hips should form a rectangle that is parallel with the floor and is not twisted.  Keeping your trunk steady, lift your right hand no higher than your shoulder and then your left leg no higher than your hip. Make sure you are not holding your breath. Hold this position __________ seconds.  Continuing to keep your abdominal muscles tense and your back steady, slowly return to your starting position. Repeat with the opposite arm and leg. Repeat __________ times. Complete this exercise __________ times per day.  STRENGTHENING - Lower Abdominals, Double Knee Lift  Lie on a firm bed or floor. Keeping your legs in front of you, bend your knees so they are both pointed toward the ceiling and your feet are flat on the floor.  Tense your abdominal muscles to brace your lower back and slowly lift both of your knees until they come over your hips. Be certain not to hold your breath.  Hold __________ seconds. Using your abdominal muscles, return to the starting position in a slow and controlled manner. Repeat __________ times. Complete this exercise __________ times per day.  POSTURE AND BODY MECHANICS CONSIDERATIONS - Low Back Strain Keeping correct posture when sitting, standing or completing your activities will reduce the stress put on different body tissues, allowing injured tissues a chance to heal and limiting painful experiences. The following are general guidelines for improved posture. Your physician or physical therapist will provide you with any instructions specific to your needs. While reading these guidelines, remember:  The  exercises prescribed by your provider will help you have the flexibility and strength to maintain correct postures.  The correct posture provides the best environment for your joints to work. All of your joints have less wear and tear when properly supported by a spine with good posture. This means you will experience a healthier, less painful body.  Correct posture must be practiced with all of your activities, especially prolonged sitting and standing. Correct posture is as important when doing repetitive low-stress activities (typing) as it is when doing a single heavy-load activity (lifting). RESTING POSITIONS Consider which positions are most painful for you when choosing a resting position. If you have pain with flexion-based activities (sitting, bending, stooping, squatting), choose a position that allows you to rest in a less flexed posture. You would want to avoid curling into a fetal position on your side. If your pain worsens with extension-based activities (prolonged standing, working overhead), avoid resting in an extended position such as sleeping on your stomach. Most people will find more comfort when they rest with their spine in a more neutral position, neither too rounded nor too arched. Lying on a non-sagging bed on your side with a pillow between your knees, or on your back with a pillow under your knees will often provide some relief. Keep in mind, being in any one position for a prolonged period of time, no matter how correct your posture, can still lead to stiffness. PROPER SITTING POSTURE In order to minimize stress and discomfort on your spine, you must sit with correct posture. Sitting with good posture should be effortless for a healthy body. Returning to good posture is a gradual  process. Many people can work toward this most comfortably by using various supports until they have the flexibility and strength to maintain this posture on their own. When sitting with proper posture,  your ears will fall over your shoulders and your shoulders will fall over your hips. You should use the back of the chair to support your upper back. Your lower back will be in a neutral position, just slightly arched. You may place a small pillow or folded towel at the base of your lower back for support.  When working at a desk, create an environment that supports good, upright posture. Without extra support, muscles tire, which leads to excessive strain on joints and other tissues. Keep these recommendations in mind: CHAIR:  A chair should be able to slide under your desk when your back makes contact with the back of the chair. This allows you to work closely.  The chair's height should allow your eyes to be level with the upper part of your monitor and your hands to be slightly lower than your elbows. BODY POSITION  Your feet should make contact with the floor. If this is not possible, use a foot rest.  Keep your ears over your shoulders. This will reduce stress on your neck and lower back. INCORRECT SITTING POSTURES  If you are feeling tired and unable to assume a healthy sitting posture, do not slouch or slump. This puts excessive strain on your back tissues, causing more damage and pain. Healthier options include:  Using more support, like a lumbar pillow.  Switching tasks to something that requires you to be upright or walking.  Talking a brief walk.  Lying down to rest in a neutral-spine position. PROLONGED STANDING WHILE SLIGHTLY LEANING FORWARD  When completing a task that requires you to lean forward while standing in one place for a long time, place either foot up on a stationary 2-4 inch high object to help maintain the best posture. When both feet are on the ground, the lower back tends to lose its slight inward curve. If this curve flattens (or becomes too large), then the back and your other joints will experience too much stress, tire more quickly, and can cause  pain. CORRECT STANDING POSTURES Proper standing posture should be assumed with all daily activities, even if they only take a few moments, like when brushing your teeth. As in sitting, your ears should fall over your shoulders and your shoulders should fall over your hips. You should keep a slight tension in your abdominal muscles to brace your spine. Your tailbone should point down to the ground, not behind your body, resulting in an over-extended swayback posture.  INCORRECT STANDING POSTURES  Common incorrect standing postures include a forward head, locked knees and/or an excessive swayback. WALKING Walk with an upright posture. Your ears, shoulders and hips should all line-up. PROLONGED ACTIVITY IN A FLEXED POSITION When completing a task that requires you to bend forward at your waist or lean over a low surface, try to find a way to stabilize 3 out of 4 of your limbs. You can place a hand or elbow on your thigh or rest a knee on the surface you are reaching across. This will provide you more stability so that your muscles do not fatigue as quickly. By keeping your knees relaxed, or slightly bent, you will also reduce stress across your lower back. CORRECT LIFTING TECHNIQUES DO :   Assume a wide stance. This will provide you more stability and the  opportunity to get as close as possible to the object which you are lifting.  Tense your abdominals to brace your spine. Bend at the knees and hips. Keeping your back locked in a neutral-spine position, lift using your leg muscles. Lift with your legs, keeping your back straight.  Test the weight of unknown objects before attempting to lift them.  Try to keep your elbows locked down at your sides in order get the best strength from your shoulders when carrying an object.  Always ask for help when lifting heavy or awkward objects. INCORRECT LIFTING TECHNIQUES DO NOT:   Lock your knees when lifting, even if it is a small object.  Bend and  twist. Pivot at your feet or move your feet when needing to change directions.  Assume that you can safely pick up even a paper clip without proper posture.   This information is not intended to replace advice given to you by your health care provider. Make sure you discuss any questions you have with your health care provider.   Document Released: 03/01/2005 Document Revised: 03/22/2014 Document Reviewed: 06/13/2008 [employer] Interactive Patient Education [employer]

## 2017-03-01 IMAGING — DX DG CHEST 2V
2 series · 2 of 2 positions shown · non-contrast
Comparison: None.

CLINICAL DATA: Patient with mid sternal chest pain radiating to the
left chest.

EXAM:
CHEST  2 VIEW

[chest pa]
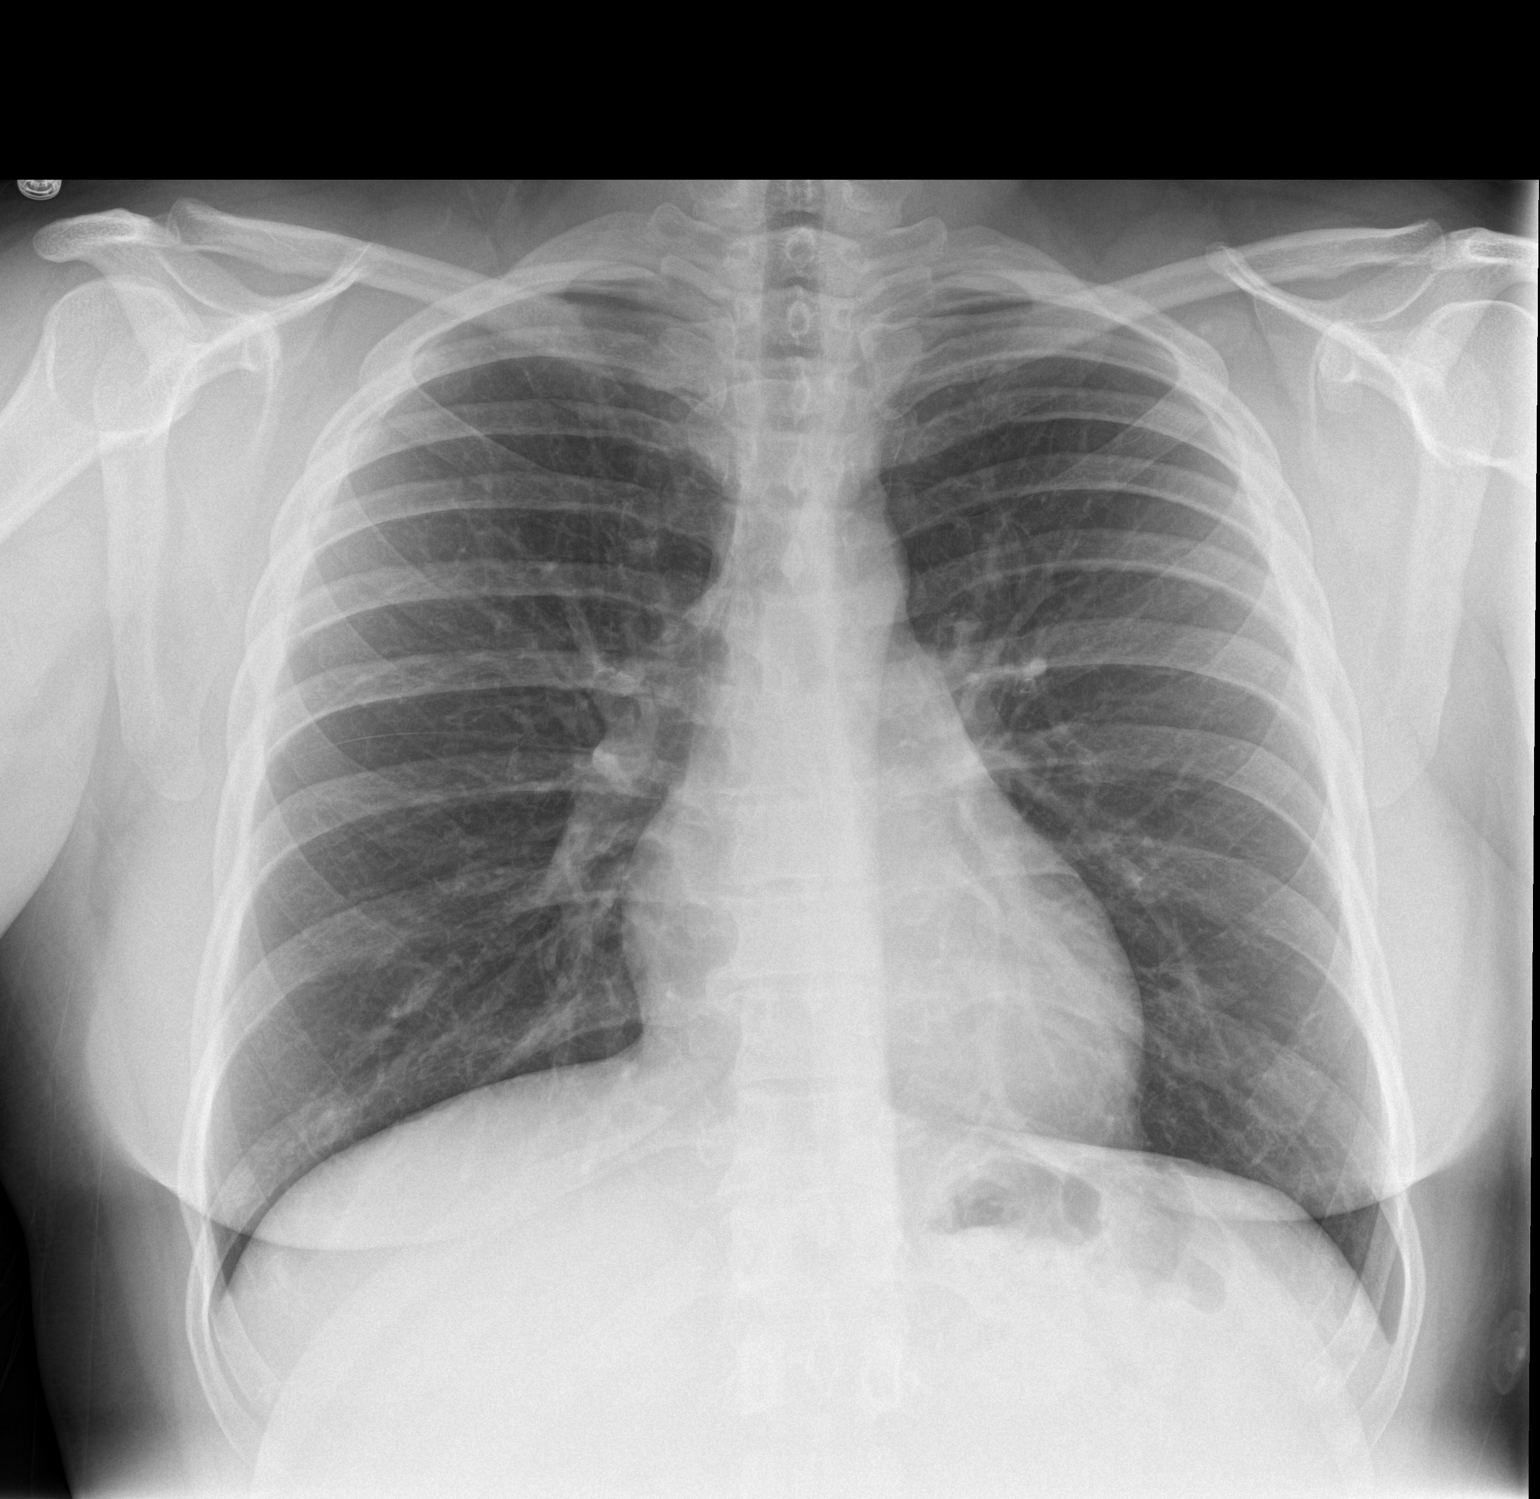

[chest lat]
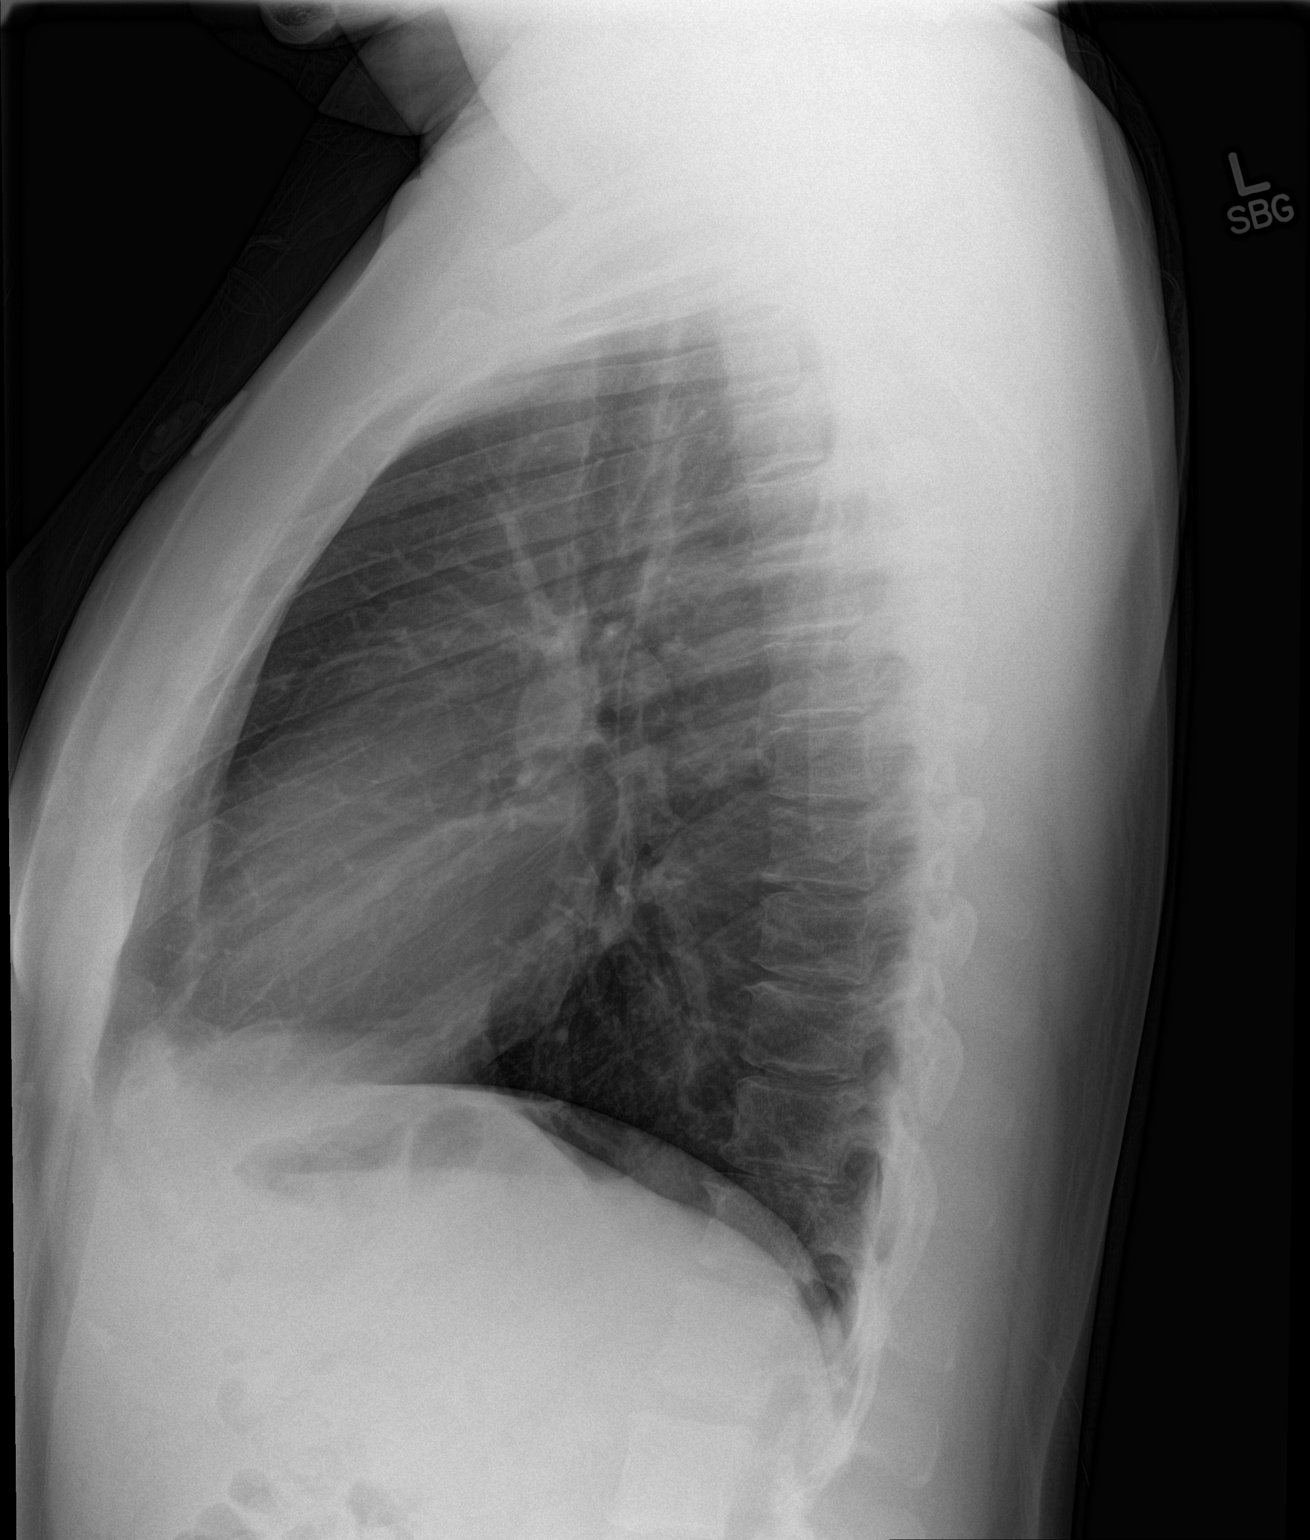

[2 of 2 positions shown; findings below may reference images not displayed]

FINDINGS: The heart size and mediastinal contours are within normal limits.
Both lungs are clear. The visualized skeletal structures are
unremarkable.
IMPRESSION: No active cardiopulmonary disease.
